# Patient Record
Sex: Male | Born: 1970 | Race: White | Hispanic: No | Marital: Single | Smoking: Current every day smoker
Health system: Southern US, Community
[De-identification: ages and names within clinical notes are randomized; demographics above are authoritative.]

## PROBLEM LIST (undated history)

## (undated) DIAGNOSIS — I2510 Atherosclerotic heart disease of native coronary artery without angina pectoris: Secondary | ICD-10-CM

## (undated) DIAGNOSIS — I4729 Other ventricular tachycardia: Secondary | ICD-10-CM

## (undated) DIAGNOSIS — E7800 Pure hypercholesterolemia, unspecified: Secondary | ICD-10-CM

## (undated) DIAGNOSIS — I255 Ischemic cardiomyopathy: Secondary | ICD-10-CM

## (undated) DIAGNOSIS — I472 Ventricular tachycardia: Secondary | ICD-10-CM

## (undated) DIAGNOSIS — Z720 Tobacco use: Secondary | ICD-10-CM

## (undated) DIAGNOSIS — I10 Essential (primary) hypertension: Secondary | ICD-10-CM

## (undated) DIAGNOSIS — F209 Schizophrenia, unspecified: Secondary | ICD-10-CM

## (undated) HISTORY — DX: Ischemic cardiomyopathy: I25.5

## (undated) HISTORY — DX: Other ventricular tachycardia: I47.29

## (undated) HISTORY — DX: Tobacco use: Z72.0

## (undated) HISTORY — DX: Ventricular tachycardia: I47.2

## (undated) HISTORY — DX: Atherosclerotic heart disease of native coronary artery without angina pectoris: I25.10

---

## 2011-03-04 ENCOUNTER — Emergency Department: Payer: Self-pay | Admitting: *Deleted

## 2011-03-04 LAB — DRUG SCREEN, URINE
Barbiturates, Ur Screen: NEGATIVE (ref ?–200)
Cannabinoid 50 Ng, Ur ~~LOC~~: NEGATIVE (ref ?–50)
Cocaine Metabolite,Ur ~~LOC~~: NEGATIVE (ref ?–300)
MDMA (Ecstasy)Ur Screen: NEGATIVE (ref ?–500)
Phencyclidine (PCP) Ur S: NEGATIVE (ref ?–25)

## 2011-03-04 LAB — URINALYSIS, COMPLETE
Bilirubin,UR: NEGATIVE
Blood: NEGATIVE
Glucose,UR: NEGATIVE mg/dL (ref 0–75)
Ketone: NEGATIVE
Ph: 6 (ref 4.5–8.0)
Protein: NEGATIVE
RBC,UR: NONE SEEN /HPF (ref 0–5)

## 2011-03-05 LAB — ETHANOL
Ethanol %: <0.003 % (ref 0.000–0.080)
Ethanol: <3 mg/dL

## 2011-03-05 LAB — COMPREHENSIVE METABOLIC PANEL
Albumin: 4 g/dL (ref 3.4–5.0)
Albumin: 3.7 g/dL (ref 3.4–5.0)
Alkaline Phosphatase: 80 U/L (ref 50–136)
Alkaline Phosphatase: 86 U/L (ref 50–136)
Anion Gap: 12 (ref 7–16)
Anion Gap: 7 (ref 7–16)
BUN: 6 mg/dL — ABNORMAL LOW (ref 7–18)
BUN: 4 mg/dL — ABNORMAL LOW (ref 7–18)
Bilirubin,Total: 0.2 mg/dL (ref 0.2–1.0)
Bilirubin,Total: 0.4 mg/dL (ref 0.2–1.0)
Calcium, Total: 8.9 mg/dL (ref 8.5–10.1)
Chloride: 101 mmol/L (ref 98–107)
Co2: 28 mmol/L (ref 21–32)
Co2: 27 mmol/L (ref 21–32)
Creatinine: 0.93 mg/dL (ref 0.60–1.30)
EGFR (African American): >60
EGFR (African American): >60
EGFR (Non-African Amer.): >60
EGFR (Non-African Amer.): >60
Glucose: 93 mg/dL (ref 65–99)
Glucose: 93 mg/dL (ref 65–99)
Osmolality: 282 (ref 275–301)
Potassium: 3.7 mmol/L (ref 3.5–5.1)
SGOT(AST): 22 U/L (ref 15–37)
SGPT (ALT): 36 U/L
SGPT (ALT): 30 U/L
Sodium: 143 mmol/L (ref 136–145)
Total Protein: 8.6 g/dL — ABNORMAL HIGH (ref 6.4–8.2)
Total Protein: 7.8 g/dL (ref 6.4–8.2)

## 2011-03-05 LAB — ACETAMINOPHEN LEVEL: Acetaminophen: <2 ug/mL

## 2011-03-05 LAB — CBC
MCH: 32 pg (ref 26.0–34.0)
MCHC: 34.1 g/dL (ref 32.0–36.0)
RDW: 12.6 % (ref 11.5–14.5)
WBC: 14.6 10*3/uL — ABNORMAL HIGH (ref 3.8–10.6)

## 2011-03-28 ENCOUNTER — Emergency Department: Payer: Self-pay | Admitting: Emergency Medicine

## 2011-03-28 LAB — ACETAMINOPHEN LEVEL: Acetaminophen: <2 ug/mL

## 2011-03-28 LAB — CBC
MCH: 31.8 pg (ref 26.0–34.0)
MCHC: 34 g/dL (ref 32.0–36.0)
MCV: 94 fL (ref 80–100)
Platelet: 270 10*3/uL (ref 150–440)
RBC: 4.7 10*6/uL (ref 4.40–5.90)
RDW: 12.7 % (ref 11.5–14.5)
WBC: 10.1 10*3/uL (ref 3.8–10.6)

## 2011-03-28 LAB — COMPREHENSIVE METABOLIC PANEL
Anion Gap: 11 (ref 7–16)
Calcium, Total: 9 mg/dL (ref 8.5–10.1)
Chloride: 105 mmol/L (ref 98–107)
Creatinine: 0.94 mg/dL (ref 0.60–1.30)
EGFR (African American): >60
Potassium: 3.7 mmol/L (ref 3.5–5.1)
SGOT(AST): 25 U/L (ref 15–37)
Sodium: 142 mmol/L (ref 136–145)
Total Protein: 8.3 g/dL — ABNORMAL HIGH (ref 6.4–8.2)

## 2011-03-28 LAB — DRUG SCREEN, URINE
Amphetamines, Ur Screen: NEGATIVE (ref ?–1000)
Barbiturates, Ur Screen: NEGATIVE (ref ?–200)
MDMA (Ecstasy)Ur Screen: NEGATIVE (ref ?–500)
Methadone, Ur Screen: NEGATIVE (ref ?–300)
Opiate, Ur Screen: NEGATIVE (ref ?–300)
Tricyclic, Ur Screen: NEGATIVE (ref ?–1000)

## 2011-03-28 LAB — ETHANOL: Ethanol: <3 mg/dL

## 2011-03-28 LAB — TSH: Thyroid Stimulating Horm: 2 u[IU]/mL

## 2011-12-20 ENCOUNTER — Emergency Department: Payer: Self-pay | Admitting: Emergency Medicine

## 2011-12-20 LAB — SALICYLATE LEVEL: Salicylates, Serum: 3.9 mg/dL — ABNORMAL HIGH

## 2011-12-20 LAB — URINALYSIS, COMPLETE
Bilirubin,UR: NEGATIVE
Blood: NEGATIVE
Ketone: NEGATIVE
Protein: NEGATIVE
RBC,UR: 1 /HPF (ref 0–5)
Specific Gravity: 1.004 (ref 1.003–1.030)
Squamous Epithelial: NONE SEEN
WBC UR: <1 /HPF (ref 0–5)

## 2011-12-20 LAB — COMPREHENSIVE METABOLIC PANEL
Albumin: 3.5 g/dL (ref 3.4–5.0)
Anion Gap: 5 — ABNORMAL LOW (ref 7–16)
BUN: 9 mg/dL (ref 7–18)
Calcium, Total: 9.1 mg/dL (ref 8.5–10.1)
Chloride: 105 mmol/L (ref 98–107)
Co2: 28 mmol/L (ref 21–32)
EGFR (African American): >60
EGFR (Non-African Amer.): >60
Glucose: 89 mg/dL (ref 65–99)
Osmolality: 274 (ref 275–301)
Potassium: 4 mmol/L (ref 3.5–5.1)
SGOT(AST): 21 U/L (ref 15–37)
Sodium: 138 mmol/L (ref 136–145)
Total Protein: 7.7 g/dL (ref 6.4–8.2)

## 2011-12-20 LAB — DRUG SCREEN, URINE
Amphetamines, Ur Screen: NEGATIVE (ref ?–1000)
Benzodiazepine, Ur Scrn: NEGATIVE (ref ?–200)
Cocaine Metabolite,Ur ~~LOC~~: NEGATIVE (ref ?–300)
MDMA (Ecstasy)Ur Screen: NEGATIVE (ref ?–500)
Tricyclic, Ur Screen: NEGATIVE (ref ?–1000)

## 2011-12-20 LAB — TSH: Thyroid Stimulating Horm: 4.08 u[IU]/mL

## 2011-12-20 LAB — CBC
HCT: 44.7 % (ref 40.0–52.0)
HGB: 16 g/dL (ref 13.0–18.0)
MCH: 32.9 pg (ref 26.0–34.0)
WBC: 13.3 10*3/uL — ABNORMAL HIGH (ref 3.8–10.6)

## 2011-12-20 LAB — ETHANOL
Ethanol %: <0.003 % (ref 0.000–0.080)
Ethanol: <3 mg/dL

## 2013-09-10 ENCOUNTER — Emergency Department: Payer: Self-pay | Admitting: Emergency Medicine

## 2013-09-10 LAB — BASIC METABOLIC PANEL
ANION GAP: 5 — AB (ref 7–16)
BUN: 3 mg/dL — AB (ref 7–18)
CHLORIDE: 108 mmol/L — AB (ref 98–107)
Calcium, Total: 8.7 mg/dL (ref 8.5–10.1)
Co2: 26 mmol/L (ref 21–32)
Creatinine: 0.94 mg/dL (ref 0.60–1.30)
EGFR (African American): >60
Glucose: 84 mg/dL (ref 65–99)
Osmolality: 273 (ref 275–301)
Potassium: 3.2 mmol/L — ABNORMAL LOW (ref 3.5–5.1)
Sodium: 139 mmol/L (ref 136–145)

## 2013-09-10 LAB — URINALYSIS, COMPLETE
BACTERIA: NONE SEEN
BILIRUBIN, UR: NEGATIVE
Blood: NEGATIVE
Glucose,UR: NEGATIVE mg/dL (ref 0–75)
KETONE: NEGATIVE
Leukocyte Esterase: NEGATIVE
NITRITE: NEGATIVE
PH: 6 (ref 4.5–8.0)
Protein: NEGATIVE
RBC,UR: <1 /HPF (ref 0–5)
SPECIFIC GRAVITY: 1.004 (ref 1.003–1.030)
SQUAMOUS EPITHELIAL: NONE SEEN

## 2013-09-10 LAB — CBC WITH DIFFERENTIAL/PLATELET
Basophil #: 0.1 10*3/uL (ref 0.0–0.1)
Basophil %: 1 %
EOS ABS: 0.3 10*3/uL (ref 0.0–0.7)
EOS PCT: 2.1 %
HCT: 47.6 % (ref 40.0–52.0)
HGB: 15.9 g/dL (ref 13.0–18.0)
LYMPHS PCT: 30.3 %
Lymphocyte #: 3.9 10*3/uL — ABNORMAL HIGH (ref 1.0–3.6)
MCH: 31.1 pg (ref 26.0–34.0)
MCHC: 33.3 g/dL (ref 32.0–36.0)
MCV: 93 fL (ref 80–100)
Monocyte #: 0.9 x10 3/mm (ref 0.2–1.0)
Monocyte %: 6.8 %
NEUTROS PCT: 59.8 %
Neutrophil #: 7.7 10*3/uL — ABNORMAL HIGH (ref 1.4–6.5)
Platelet: 258 10*3/uL (ref 150–440)
RBC: 5.09 10*6/uL (ref 4.40–5.90)
RDW: 13.9 % (ref 11.5–14.5)
WBC: 12.9 10*3/uL — ABNORMAL HIGH (ref 3.8–10.6)

## 2014-04-29 NOTE — Consult Note (Signed)
[name] [name], BAYLE MR#:  [id number] DATE OF BIRTH:  30-Jan-1970  DATE OF CONSULTATION:  12/21/2011  REFERRING PHYSICIAN:  Dorothea Glassman, MD CONSULTING PHYSICIAN:  Jolanta B. Pucilowska, MD  REASON FOR CONSULTATION: To evaluate a [name] with agitation.   IDENTIFYING DATA: [name] [name] is a 44 year old male with history of schizophrenia.   CHIEF COMPLAINT: " I'm very sorry. "   HISTORY OF PRESENT ILLNESS: [name] [name] has a long history of mental illness. He was brought to [facility] at the beginning of 2013 from [place]. He likes his group home and feels that they take good care of him and that everybody gets along well. For reasons that he cannot understand, on the night of admission, he got mad at the group home, kicked the table and over turned it. He was brought to the Emergency Room. The [name] believes that all medications prescribed are very helpful and that the group home is a good place where he receives good care. He is somewhat clueless about what lead to his misbehavior. He spoke with his father on the phone today. The father felt that the [name] sounded very good and was quite surprised with events leading to admission. The [name] is allowed to return to the group home, but they would like his medications to be adjusted. The [name] denies any symptoms of depression, anxiety, or psychosis. There is no history of alcohol, illicit substance, or prescription pill abuse.   PAST PSYCHIATRIC HISTORY: The [name] had multiple prior hospitalizations including one admission to [facility] that lasted 8 months. He appears to be very stable on the current regimen of medications. He is compliant. There is no history of suicide attempt that we know of.   FAMILY PSYCHIATRIC HISTORY: Unknown.   PAST MEDICAL HISTORY: None.   ALLERGIES: No known drug allergies.   MEDICATIONS ON ADMISSION:  1. Zyprexa 15 mg twice daily.  2. Haldol 5 mg twice daily.  3. Vistaril 50 mg 4 times  daily as needed for anxiety.  4. Neurontin 400 mg 3 times daily.  5. Cogentin 0.5 mg twice daily.   SOCIAL HISTORY: We have limited information. The [name] is originally from [place] and was placed in our area almost a year ago. He has Medicare and Medicaid.   MENTAL STATUS EXAMINATION: The [name] is alert and oriented to person, place, time, and situation. He is very pleasant, polite, and cooperative, rather apologetic about what happened at the group home even though he does not remember or does not want to disclose much details. He does not feel angry at any staff or peer at the home. He is wearing hospital scrubs. He maintains excellent eye contact. His speech is of normal rhythm, rate, and volume. Mood is fine with full affect. Thought processing is logical and goal oriented. Thought content: He denies thoughts of hurting himself or others. There are no delusions or paranoia. There is no evidence of auditory or visual hallucinations. His cognition is grossly intact. He registers three out of three and recalls two out of three objects after five minutes. He can spell cat forward and backward. He knows the current president. His insight and judgment are questionable.   SUICIDE RISK ASSESSMENT: This is a [name] with long history of schizophrenia who was brought to the Emergency Room after he got upset at the group home and overturned a table. There was no aggression towards people in the house. He is apologetic and is thrilled to be able to return to  his group home.   DIAGNOSES:   AXIS I: Undifferentiated schizophrenia.   AXIS II: Deferred.   AXIS III: Deferred.   AXIS IV: Mental illness, conflict at the group home.   AXIS V: GAF 45.   PLAN:  1. The [name] does not meet criteria for involuntary inpatient psychiatric commitment. Please discharge as appropriate.  2. He is to continue all medications as prescribed by his primary psychiatrist. No prescriptions necessary.  3. He will  follow up with Dr. Alver FisherMickiewicz at [facility] on 12/25/2011 at 10 o'clock .  4. His group owner will pick him up. ____________________________ Braulio ConteJolanta B. Jennet MaduroPucilowska, MD jbp:slb D: 12/22/2011 14:17:46 ET T: 12/22/2011 17:48:54 ET JOB#: [id number]  cc: Jolanta B. Jennet MaduroPucilowska, MD, <Dictator> Shari ProwsJOLANTA B PUCILOWSKA MD ELECTRONICALLY SIGNED 12/24/2011 2:18

## 2014-04-29 NOTE — Consult Note (Signed)
Brief Consult Note: Diagnosis: Schizophrenia.   Patient was seen by consultant.   Consult note dictated.   Recommend further assessment or treatment.   Orders entered.   Comments: Mr. Veryl Speaklyler has a h/o schizophrenia. His medications were adjusted recently. Some medications were not picked up from the pharmacy causing the patient to loose his composure. He overturned a table at a group home. Hwe feels guilty about it as they all get along fine there. He is allowed to return to the same group homer.   PLAN: 1. The patient does not meet criteria for IVC. Please discharge as appropriate.   2. He is to continue all medications as prescribed by his primary psychiatrist. No Rx necessary.  3. He will follow up with Dr. Alver FisherMickiewicz on 12/215/2013 at 10:00.   4. Group home owner will pick him up.  Electronic Signatures: Kristine LineaPucilowska, Jolanta (MD)  (Signed 11-Dec-13 16:02)  Authored: Brief Consult Note   Last Updated: 11-Dec-13 16:02 by Kristine LineaPucilowska, Jolanta (MD)

## 2014-04-29 NOTE — Consult Note (Signed)
PATIENT NAMKatheren [name]:  [name] MR#:  [id number] DATE OF BIRTH:  1970-04-27  DATE OF CONSULTATION:  12/21/2011  CONTINUATION:  CONSULTING PHYSICIAN:  Jolanta B. Pucilowska, MD  REVIEW OF SYSTEMS: CONSTITUTIONAL: No fevers or chills. EYES: No double or blurred vision. ENT: No hearing loss. RESPIRATORY: No shortness of breath or cough. CARDIOVASCULAR: No chest pain or orthopnea. GASTROINTESTINAL: No abdominal pain, nausea, vomiting, or diarrhea. GU: No incontinence or frequency. ENDOCRINE: No heat or cold intolerance. LYMPHATIC: No anemia or easy bruising. INTEGUMENTARY: No acne or rash. MUSCULOSKELETAL: No muscle or joint pain. NEUROLOGIC: No tingling or weakness. PSYCHIATRIC: See history of present illness for details.   PHYSICAL EXAMINATION:  VITAL SIGNS: Blood pressure 142/84, pulse 76, respirations 18.   GENERAL: This is a slender middle-aged male wearing a beard, in no acute distress. The rest of the physical examination is deferred to his primary attending.   LABORATORY DATA: Chemistries are within normal limits. Blood alcohol level is zero. LFTs are within normal limits except for alkaline phosphatase of 149, TSH 4.08. Urine tox screen negative for substances. CBC within normal limits except for white blood count of 13.3. Urinalysis is not suggestive of urinary tract infection. Serum acetaminophen less than 2.0. Serum salicylates 3.9.  ____________________________ Braulio ConteJolanta B. Jennet MaduroPucilowska, MD jbp:cbb D: 12/22/2011 14:20:24 ET T: 12/22/2011 18:04:42 ET JOB#: [id number]  cc: Jolanta B. Jennet MaduroPucilowska, MD, <Dictator> Shari ProwsJOLANTA B PUCILOWSKA MD ELECTRONICALLY SIGNED 12/24/2011 2:18

## 2014-09-18 ENCOUNTER — Encounter: Payer: Self-pay | Admitting: Emergency Medicine

## 2014-09-18 DIAGNOSIS — F0390 Unspecified dementia without behavioral disturbance: Secondary | ICD-10-CM | POA: Diagnosis present

## 2014-09-18 DIAGNOSIS — K8064 Calculus of gallbladder and bile duct with chronic cholecystitis without obstruction: Secondary | ICD-10-CM | POA: Diagnosis not present

## 2014-09-18 DIAGNOSIS — F17210 Nicotine dependence, cigarettes, uncomplicated: Secondary | ICD-10-CM | POA: Diagnosis present

## 2014-09-18 DIAGNOSIS — E780 Pure hypercholesterolemia: Secondary | ICD-10-CM | POA: Diagnosis present

## 2014-09-18 DIAGNOSIS — R740 Nonspecific elevation of levels of transaminase and lactic acid dehydrogenase [LDH]: Secondary | ICD-10-CM | POA: Diagnosis not present

## 2014-09-18 DIAGNOSIS — F209 Schizophrenia, unspecified: Secondary | ICD-10-CM | POA: Diagnosis present

## 2014-09-18 LAB — CBC WITH DIFFERENTIAL/PLATELET
BASOS ABS: 0.1 10*3/uL (ref 0–0.1)
Basophils Relative: 1 %
EOS ABS: 0 10*3/uL (ref 0–0.7)
EOS PCT: 0 %
HCT: 48.6 % (ref 40.0–52.0)
Hemoglobin: 16.6 g/dL (ref 13.0–18.0)
LYMPHS PCT: 11 %
Lymphs Abs: 1.5 10*3/uL (ref 1.0–3.6)
MCH: 31 pg (ref 26.0–34.0)
MCHC: 34.2 g/dL (ref 32.0–36.0)
MCV: 90.6 fL (ref 80.0–100.0)
MONO ABS: 0.8 10*3/uL (ref 0.2–1.0)
Monocytes Relative: 6 %
Neutro Abs: 10.9 10*3/uL — ABNORMAL HIGH (ref 1.4–6.5)
Neutrophils Relative %: 82 %
PLATELETS: 265 10*3/uL (ref 150–440)
RBC: 5.37 MIL/uL (ref 4.40–5.90)
RDW: 14.7 % — ABNORMAL HIGH (ref 11.5–14.5)
WBC: 13.4 10*3/uL — ABNORMAL HIGH (ref 3.8–10.6)

## 2014-09-18 LAB — COMPREHENSIVE METABOLIC PANEL
ALBUMIN: 4 g/dL (ref 3.5–5.0)
ALT: 190 U/L — ABNORMAL HIGH (ref 17–63)
AST: 172 U/L — AB (ref 15–41)
Alkaline Phosphatase: 581 U/L — ABNORMAL HIGH (ref 38–126)
Anion gap: 12 (ref 5–15)
CHLORIDE: 102 mmol/L (ref 101–111)
CO2: 26 mmol/L (ref 22–32)
Calcium: 9.2 mg/dL (ref 8.9–10.3)
Creatinine, Ser: 0.94 mg/dL (ref 0.61–1.24)
GFR calc Af Amer: >60 mL/min (ref >60)
GFR calc non Af Amer: >60 mL/min (ref >60)
GLUCOSE: 129 mg/dL — AB (ref 65–99)
POTASSIUM: 3.3 mmol/L — AB (ref 3.5–5.1)
SODIUM: 140 mmol/L (ref 135–145)
Total Bilirubin: 3 mg/dL — ABNORMAL HIGH (ref 0.3–1.2)
Total Protein: 8.7 g/dL — ABNORMAL HIGH (ref 6.5–8.1)

## 2014-09-18 LAB — LIPASE, BLOOD: LIPASE: 23 U/L (ref 22–51)

## 2014-09-18 LAB — URINALYSIS COMPLETE WITH MICROSCOPIC (ARMC ONLY)
BACTERIA UA: NONE SEEN
GLUCOSE, UA: NEGATIVE mg/dL
HGB URINE DIPSTICK: NEGATIVE
Ketones, ur: NEGATIVE mg/dL
Nitrite: NEGATIVE
Protein, ur: 30 mg/dL — AB
SPECIFIC GRAVITY, URINE: 1.014 (ref 1.005–1.030)
pH: 5 (ref 5.0–8.0)

## 2014-09-18 NOTE — ED Notes (Signed)
Pt presents to ED with mid abd pain and vomiting. Pt reports his symptoms have been intermittently reoccurring for the past 3 months and has worsened today. Pt states he was seen by [facility] today and was told he may have ulcers and referred him to a GI specialist. Pt states they didn't do anything to fix his pain so he came here for a second opinion. Pt denies blood in his stool  Or excessive ETOH use.

## 2014-09-19 ENCOUNTER — Emergency Department: Payer: Medicare Other

## 2014-09-19 ENCOUNTER — Inpatient Hospital Stay
Admission: EM | Admit: 2014-09-19 | Discharge: 2014-09-26 | DRG: 419 | Disposition: A | Discharge status: Home / Self Care | Payer: Medicare Other | Attending: Surgery | Admitting: Surgery

## 2014-09-19 ENCOUNTER — Inpatient Hospital Stay: Payer: Medicare Other

## 2014-09-19 DIAGNOSIS — Z4659 Encounter for fitting and adjustment of other gastrointestinal appliance and device: Secondary | ICD-10-CM | POA: Diagnosis not present

## 2014-09-19 DIAGNOSIS — F0390 Unspecified dementia without behavioral disturbance: Secondary | ICD-10-CM | POA: Diagnosis present

## 2014-09-19 DIAGNOSIS — R740 Nonspecific elevation of levels of transaminase and lactic acid dehydrogenase [LDH]: Secondary | ICD-10-CM

## 2014-09-19 DIAGNOSIS — E780 Pure hypercholesterolemia: Secondary | ICD-10-CM | POA: Diagnosis present

## 2014-09-19 DIAGNOSIS — R109 Unspecified abdominal pain: Secondary | ICD-10-CM

## 2014-09-19 DIAGNOSIS — R7401 Elevation of levels of liver transaminase levels: Secondary | ICD-10-CM

## 2014-09-19 DIAGNOSIS — K8064 Calculus of gallbladder and bile duct with chronic cholecystitis without obstruction: Secondary | ICD-10-CM | POA: Diagnosis present

## 2014-09-19 DIAGNOSIS — R100 Acute abdomen: Secondary | ICD-10-CM | POA: Diagnosis not present

## 2014-09-19 DIAGNOSIS — K8050 Calculus of bile duct without cholangitis or cholecystitis without obstruction: Secondary | ICD-10-CM | POA: Diagnosis not present

## 2014-09-19 DIAGNOSIS — F17210 Nicotine dependence, cigarettes, uncomplicated: Secondary | ICD-10-CM | POA: Diagnosis present

## 2014-09-19 DIAGNOSIS — K8020 Calculus of gallbladder without cholecystitis without obstruction: Secondary | ICD-10-CM

## 2014-09-19 DIAGNOSIS — F209 Schizophrenia, unspecified: Secondary | ICD-10-CM | POA: Diagnosis present

## 2014-09-19 HISTORY — DX: Pure hypercholesterolemia, unspecified: E78.00

## 2014-09-19 HISTORY — DX: Schizophrenia, unspecified: F20.9

## 2014-09-19 LAB — CBC
HCT: 45.3 % (ref 40.0–52.0)
HEMOGLOBIN: 15.6 g/dL (ref 13.0–18.0)
MCH: 31.2 pg (ref 26.0–34.0)
MCHC: 34.4 g/dL (ref 32.0–36.0)
MCV: 90.7 fL (ref 80.0–100.0)
PLATELETS: 235 10*3/uL (ref 150–440)
RBC: 4.99 MIL/uL (ref 4.40–5.90)
RDW: 14.4 % (ref 11.5–14.5)
WBC: 10 10*3/uL (ref 3.8–10.6)

## 2014-09-19 LAB — COMPREHENSIVE METABOLIC PANEL
ALBUMIN: 3.4 g/dL — AB (ref 3.5–5.0)
ALK PHOS: 526 U/L — AB (ref 38–126)
ALT: 180 U/L — AB (ref 17–63)
AST: 160 U/L — ABNORMAL HIGH (ref 15–41)
Anion gap: 7 (ref 5–15)
BILIRUBIN TOTAL: 2.4 mg/dL — AB (ref 0.3–1.2)
CALCIUM: 9 mg/dL (ref 8.9–10.3)
CO2: 30 mmol/L (ref 22–32)
CREATININE: 0.83 mg/dL (ref 0.61–1.24)
Chloride: 105 mmol/L (ref 101–111)
GFR calc Af Amer: >60 mL/min (ref >60)
GFR calc non Af Amer: >60 mL/min (ref >60)
GLUCOSE: 94 mg/dL (ref 65–99)
Potassium: 3.4 mmol/L — ABNORMAL LOW (ref 3.5–5.1)
SODIUM: 142 mmol/L (ref 135–145)
TOTAL PROTEIN: 7.4 g/dL (ref 6.5–8.1)

## 2014-09-19 MED ORDER — PANTOPRAZOLE SODIUM 40 MG PO TBEC
40.0000 mg | DELAYED_RELEASE_TABLET | Freq: Every day | ORAL | Status: DC
  Administered 2014-09-19 – 2014-09-25 (×6): 40 mg via ORAL
  Filled 2014-09-19 (×8): qty 1

## 2014-09-19 MED ORDER — GABAPENTIN 400 MG PO CAPS
400.0000 mg | ORAL_CAPSULE | Freq: Three times a day (TID) | ORAL | Status: DC
  Administered 2014-09-19 – 2014-09-26 (×19): 400 mg via ORAL
  Filled 2014-09-19 (×20): qty 1

## 2014-09-19 MED ORDER — OLANZAPINE 7.5 MG PO TABS
15.0000 mg | ORAL_TABLET | Freq: Two times a day (BID) | ORAL | Status: DC
  Administered 2014-09-19 – 2014-09-26 (×13): 15 mg via ORAL
  Filled 2014-09-19 (×13): qty 2

## 2014-09-19 MED ORDER — HALOPERIDOL 5 MG PO TABS
5.0000 mg | ORAL_TABLET | Freq: Two times a day (BID) | ORAL | Status: DC
  Administered 2014-09-19 – 2014-09-26 (×13): 5 mg via ORAL
  Filled 2014-09-19 (×13): qty 1

## 2014-09-19 MED ORDER — FAMOTIDINE 20 MG PO TABS
20.0000 mg | ORAL_TABLET | Freq: Every day | ORAL | Status: DC
  Administered 2014-09-19 – 2014-09-26 (×7): 20 mg via ORAL
  Filled 2014-09-19 (×8): qty 1

## 2014-09-19 MED ORDER — HYDROXYZINE HCL 50 MG PO TABS
50.0000 mg | ORAL_TABLET | Freq: Four times a day (QID) | ORAL | Status: DC | PRN
  Administered 2014-09-19: 50 mg via ORAL
  Filled 2014-09-19 (×2): qty 1

## 2014-09-19 MED ORDER — SODIUM CHLORIDE 0.9 % IV SOLN
INTRAVENOUS | Status: AC
  Filled 2014-09-19: qty 3

## 2014-09-19 MED ORDER — ATORVASTATIN CALCIUM 20 MG PO TABS
40.0000 mg | ORAL_TABLET | Freq: Every day | ORAL | Status: DC
  Administered 2014-09-21 – 2014-09-26 (×6): 40 mg via ORAL
  Filled 2014-09-19 (×7): qty 2

## 2014-09-19 MED ORDER — SODIUM CHLORIDE 0.9 % IV SOLN
3.0000 g | Freq: Four times a day (QID) | INTRAVENOUS | Status: DC
  Administered 2014-09-19 – 2014-09-20 (×6): 3 g via INTRAVENOUS
  Filled 2014-09-19 (×9): qty 3

## 2014-09-19 MED ORDER — NICOTINE 10 MG IN INHA
1.0000 | RESPIRATORY_TRACT | Status: DC | PRN
  Administered 2014-09-19 – 2014-09-25 (×10): 1 via RESPIRATORY_TRACT
  Filled 2014-09-19 (×2): qty 36

## 2014-09-19 MED ORDER — BUDESONIDE 0.25 MG/2ML IN SUSP
0.2500 mg | Freq: Two times a day (BID) | RESPIRATORY_TRACT | Status: DC
  Administered 2014-09-19 – 2014-09-22 (×7): 0.25 mg via RESPIRATORY_TRACT
  Filled 2014-09-19 (×12): qty 2

## 2014-09-19 MED ORDER — HYDROCODONE-ACETAMINOPHEN 5-325 MG PO TABS
1.0000 | ORAL_TABLET | ORAL | Status: DC | PRN
  Administered 2014-09-20 – 2014-09-21 (×2): 1 via ORAL
  Administered 2014-09-22 – 2014-09-26 (×2): 2 via ORAL
  Filled 2014-09-19: qty 2
  Filled 2014-09-19: qty 1
  Filled 2014-09-19: qty 2
  Filled 2014-09-19: qty 1

## 2014-09-19 MED ORDER — ALBUTEROL SULFATE HFA 108 (90 BASE) MCG/ACT IN AERS: 2.0000 | INHALATION_SPRAY | RESPIRATORY_TRACT | Status: DC | PRN

## 2014-09-19 MED ORDER — ONDANSETRON HCL 4 MG/2ML IJ SOLN
4.0000 mg | Freq: Four times a day (QID) | INTRAMUSCULAR | Status: DC | PRN
  Administered 2014-09-22: 4 mg via INTRAVENOUS
  Filled 2014-09-19 (×2): qty 2

## 2014-09-19 MED ORDER — MORPHINE SULFATE (PF) 2 MG/ML IV SOLN
2.0000 mg | INTRAVENOUS | Status: DC | PRN
  Administered 2014-09-20 – 2014-09-23 (×9): 2 mg via INTRAVENOUS
  Filled 2014-09-19 (×9): qty 1

## 2014-09-19 MED ORDER — LORAZEPAM 1 MG PO TABS
1.0000 mg | ORAL_TABLET | Freq: Two times a day (BID) | ORAL | Status: DC
  Administered 2014-09-19 – 2014-09-26 (×13): 1 mg via ORAL
  Filled 2014-09-19 (×13): qty 1

## 2014-09-19 MED ORDER — HEPARIN SODIUM (PORCINE) 5000 UNIT/ML IJ SOLN
5000.0000 [IU] | Freq: Three times a day (TID) | INTRAMUSCULAR | Status: DC
Start: 2014-09-19 — End: 2014-09-22
  Administered 2014-09-19 – 2014-09-21 (×8): 5000 [IU] via SUBCUTANEOUS
  Filled 2014-09-19 (×8): qty 1

## 2014-09-19 MED ORDER — HYDROXYZINE HCL 50 MG PO TABS: 50.0000 mg | ORAL_TABLET | Freq: Four times a day (QID) | ORAL | Status: DC | PRN

## 2014-09-19 MED ORDER — ONDANSETRON HCL 4 MG PO TABS: 4.0000 mg | ORAL_TABLET | Freq: Four times a day (QID) | ORAL | Status: DC | PRN

## 2014-09-19 MED ORDER — GADOBENATE DIMEGLUMINE 529 MG/ML IV SOLN
15.0000 mL | Freq: Once | INTRAVENOUS | Status: AC | PRN
  Administered 2014-09-19: 13 mL via INTRAVENOUS

## 2014-09-19 MED ORDER — LACTATED RINGERS IV SOLN
INTRAVENOUS | Status: DC
  Administered 2014-09-19: 21:00:00 via INTRAVENOUS
  Administered 2014-09-19: 1000 mL via INTRAVENOUS
  Administered 2014-09-19 – 2014-09-20 (×2): via INTRAVENOUS

## 2014-09-19 MED ORDER — BENZTROPINE MESYLATE 1 MG PO TABS
0.5000 mg | ORAL_TABLET | Freq: Two times a day (BID) | ORAL | Status: DC
  Administered 2014-09-19 – 2014-09-26 (×13): 0.5 mg via ORAL
  Filled 2014-09-19 (×14): qty 1

## 2014-09-19 MED ORDER — ALBUTEROL SULFATE (2.5 MG/3ML) 0.083% IN NEBU
3.0000 mL | INHALATION_SOLUTION | RESPIRATORY_TRACT | Status: DC | PRN
  Filled 2014-09-19 (×2): qty 3

## 2014-09-19 NOTE — Progress Notes (Signed)
Patient given PO morning medication per patient request and due to pending MRCP, patient will remain NPO 4 hours prior to procedure

## 2014-09-19 NOTE — Progress Notes (Signed)
Patient ID: [name], male   DOB: [birth year], 44 y.o.   MRN: [id number]   Assuming care from Dr. Excell Seltzer. I have personally reviewed the images of the ultrasound and laboratory values. Total bilirubin is now 3 from 4 last night. The patient is without pain. There is dilatation on the ultrasound of the common bile duct and the such an MRCP will be obtained. Further disposition based on findings.

## 2014-09-19 NOTE — ED Notes (Signed)
Please notify Viviann Spare (caregiver at group home where the pt resides) when the pt is ready for admission or discharge [phone].

## 2014-09-19 NOTE — ED Notes (Signed)
Spoke with Matt from pharmacy who verbally verified the compatibility of infusing Unasyn concurrently with LR.

## 2014-09-19 NOTE — ED Provider Notes (Signed)
[facility] Emergency Department Provider Note  ____________________________________________  Time seen: 2:00 AM  I have reviewed the triage vital signs and the nursing notes.   HISTORY  Chief Complaint Abdominal Pain and Emesis      HPI [name] is a 44 y.o. male presents with mid epigastric/right upper quadrant pain and vomiting times proximally 3 months with acute worsening today. Patient states that the pain is aggravated after eating. Patient denies any diarrhea no fever. Patient states that he was told that he may have ulcers by GI specialist. Current pain score 8 out of 10 right upper quadrant/epigastric and nonradiating. Patient denies any alleviating factors   Past Medical History  Diagnosis Date  . Schizophrenia   . Hypercholesteremia     There are no active problems to display for this patient.   Past surgical history  the No current outpatient prescriptions on file.  Allergies None No family history on file.  Social History Social History  Substance Use Topics  . Smoking status: Current Every Day Smoker -- 1.00 packs/day    Types: Cigarettes  . Smokeless tobacco: None  . Alcohol Use: No    Review of Systems  Constitutional: Negative for fever. Eyes: Negative for visual changes. ENT: Negative for sore throat. Cardiovascular: Negative for chest pain. Respiratory: Negative for shortness of breath. Gastrointestinal: Positive for abdominal pain, positive for vomiting. Genitourinary: Negative for dysuria. Musculoskeletal: Negative for back pain. Skin: Negative for rash. Neurological: Negative for headaches, focal weakness or numbness.   10-point ROS otherwise negative.  ____________________________________________   PHYSICAL EXAM:  VITAL SIGNS: ED Triage Vitals  Enc Vitals Group     BP 09/18/14 2004 134/84 mmHg     Pulse Rate 09/18/14 2004 95     Resp 09/18/14 2004 18     Temp 09/18/14 2004 98.3 F (36.8 C)      Temp Source 09/18/14 2004 Oral     SpO2 09/18/14 2004 97 %     Weight 09/18/14 2004 150 lb (68.04 kg)     Height 09/18/14 2004  (1.753 m)     Head Cir --      Peak Flow --      Pain Score 09/18/14 2005 10     Pain Loc --      Pain Edu? --      Excl. in GC? --      Constitutional: Alert and oriented. Well appearing and in no distress. Eyes: Conjunctivae are normal. PERRL. Normal extraocular movements. ENT   Head: Normocephalic and atraumatic.   Nose: No congestion/rhinnorhea.   Mouth/Throat: Mucous membranes are moist.   Neck: No stridor. Hematological/Lymphatic/Immunilogical: No cervical lymphadenopathy. Cardiovascular: Normal rate, regular rhythm. Normal and symmetric distal pulses are present in all extremities. No murmurs, rubs, or gallops. Respiratory: Normal respiratory effort without tachypnea nor retractions. Breath sounds are clear and equal bilaterally. No wheezes/rales/rhonchi. Gastrointestinal: Right upper quadrant/epigastric pain with palpation. No distention. There is no CVA tenderness. Genitourinary: deferred Musculoskeletal: Nontender with normal range of motion in all extremities. No joint effusions.  No lower extremity tenderness nor edema. Neurologic:  Normal speech and language. No gross focal neurologic deficits are appreciated. Speech is normal.  Skin:  Skin is warm, dry and intact. No rash noted. Psychiatric: Mood and affect are normal. Speech and behavior are normal. Patient exhibits appropriate insight and judgment.  ____________________________________________    LABS (pertinent positives/negatives)  Labs Reviewed  CBC WITH DIFFERENTIAL/PLATELET - Abnormal; Notable for the following:  WBC 13.4 (*)    RDW 14.7 (*)    Neutro Abs 10.9 (*)    All other components within normal limits  COMPREHENSIVE METABOLIC PANEL - Abnormal; Notable for the following:    Potassium 3.3 (*)    Glucose, Bld 129 (*)    BUN <5 (*)    Total Protein 8.7  (*)    AST 172 (*)    ALT 190 (*)    Alkaline Phosphatase 581 (*)    Total Bilirubin 3.0 (*)    All other components within normal limits  URINALYSIS COMPLETEWITH MICROSCOPIC ([facility] ONLY) - Abnormal; Notable for the following:    Color, Urine AMBER (*)    APPearance CLEAR (*)    Bilirubin Urine 2+ (*)    Protein, ur 30 (*)    Leukocytes, UA TRACE (*)    Squamous Epithelial / LPF 0-5 (*)    All other components within normal limits  LIPASE, BLOOD      RADIOLOGY  US Abdomen Limited RUQ (Final result) Result time: 09/19/14 02:25:03   Final result by Rad Results In Interface (09/19/14 02:25:03)   Narrative:   CLINICAL DATA: Abdominal pain off and on for 3-4 months. Transaminitis.  EXAM: US ABDOMEN LIMITED - RIGHT UPPER QUADRANT  COMPARISON: None.  FINDINGS: Gallbladder:  Cholelithiasis with multiple stones in the gallbladder. Additional stone in the gallbladder neck was nonmobile and measures 1 cm diameter. No gallbladder wall thickening or edema. Murphy's sign is negative.  Common bile duct:  Diameter: 4.9 mm, normal  Liver:  No focal lesion identified. Within normal limits in parenchymal echogenicity.  IMPRESSION: Cholelithiasis without additional findings of cholecystitis.   Electronically Signed By: Burman Nieves M.D. On: 09/19/2014 02:25      INITIAL IMPRESSION / ASSESSMENT AND PLAN / ED COURSE  Pertinent labs & imaging results that were available during my care of the patient were reviewed by me and considered in my medical decision making (see chart for details).  History of physical exam concerning for possible choledocholithiasis given elevated liver enzymes and bilirubin plus leukocytosis. As such patient was discussed with Dr. Excell Seltzer for evaluation.  ____________________________________________   FINAL CLINICAL IMPRESSION(S) / ED DIAGNOSES  Final diagnoses:  Calculus of gallbladder without cholecystitis without obstruction       Darci Current, MD 09/19/14 [phone]

## 2014-09-19 NOTE — H&P (Signed)
[name] is an 44 y.o. male.    Chief Complaint: Right upper quadrant pain, resolved  HPI: This a patient with 4 months of abdominal pain that he could not take it anymore so he came to the emergency room tonight however at the time of my visit his pain is completely resolved. He points the right upper quadrant as a source of his pain. He's had nausea but no emesis no fevers or chills he's not sure if he's had a dark urine and has not noticed any acholic stools no melena or hematochezia he denies significant back pain. He is treated for significant schizophrenia and is disabled for that but has not had any abdominal surgery.  Past Medical History  Diagnosis Date  . Schizophrenia   . Hypercholesteremia     No past surgical history on file.  No family history on file. Social History:  reports that he has been smoking Cigarettes.  He has been smoking about 1.00 pack per day. He does not have any smokeless tobacco history on file. He reports that he does not drink alcohol. His drug history is not on file.  Allergies: No Known Allergies   (Not in a hospital admission)   Review of Systems  Constitutional: Negative for fever and chills.  HENT: Negative.   Eyes: Negative.   Respiratory: Negative.   Cardiovascular: Negative.   Gastrointestinal: Positive for nausea and abdominal pain. Negative for heartburn, vomiting, diarrhea, constipation, blood in stool and melena.  Genitourinary: Negative.   Musculoskeletal: Negative.   Skin: Negative.   Neurological: Negative.  Negative for weakness.  Endo/Heme/Allergies: Negative.   Psychiatric/Behavioral:       Patient is schizophrenic     Physical Exam:  BP 116/84 mmHg  Pulse 90  Temp(Src) 97.7 F (36.5 C) (Oral)  Resp 16  Ht 5\' 9"  (1.753 m)  Wt 150 lb (68.04 kg)  BMI 22.14 kg/m2  SpO2 98%  Physical Exam  Constitutional: He is oriented to person, place, and time and well-developed, well-nourished, and in no distress. No  distress.  HENT:  Head: Normocephalic and atraumatic.  Eyes: No scleral icterus.  Neck: Normal range of motion.  Cardiovascular: Normal rate and regular rhythm.   Pulmonary/Chest: Effort normal and breath sounds normal. No respiratory distress. He has no wheezes. He has no rales.  Abdominal: Soft. Bowel sounds are normal. He exhibits no distension. There is no tenderness. There is no rebound and no guarding.  No right upper quadrant tenderness no Murphy sign.  Musculoskeletal: Normal range of motion.  Lymphadenopathy:    He has no cervical adenopathy.  Neurological: He is alert and oriented to person, place, and time.  Skin: Skin is warm and dry.  Psychiatric: Mood and affect normal.        Results for orders placed or performed during the hospital encounter of 09/19/14 (from the past 48 hour(s))  CBC WITH DIFFERENTIAL     Status: Abnormal   Collection Time: 09/18/14  8:39 PM  Result Value Ref Range   WBC 13.4 (H) 3.8 - 10.6 K/uL   RBC 5.37 4.40 - 5.90 MIL/uL   Hemoglobin 16.6 13.0 - 18.0 g/dL   HCT 11/18/14 40.6 - 84.0 %   MCV 90.6 80.0 - 100.0 fL   MCH 31.0 26.0 - 34.0 pg   MCHC 34.2 32.0 - 36.0 g/dL   RDW 33.5 (H) 33.1 - 74.0 %   Platelets 265 150 - 440 K/uL   Neutrophils Relative % 82 %  Neutro Abs 10.9 (H) 1.4 - 6.5 K/uL   Lymphocytes Relative 11 %   Lymphs Abs 1.5 1.0 - 3.6 K/uL   Monocytes Relative 6 %   Monocytes Absolute 0.8 0.2 - 1.0 K/uL   Eosinophils Relative 0 %   Eosinophils Absolute 0.0 0 - 0.7 K/uL   Basophils Relative 1 %   Basophils Absolute 0.1 0 - 0.1 K/uL  Comprehensive metabolic panel     Status: Abnormal   Collection Time: 09/18/14  8:39 PM  Result Value Ref Range   Sodium 140 135 - 145 mmol/L   Potassium 3.3 (L) 3.5 - 5.1 mmol/L   Chloride 102 101 - 111 mmol/L   CO2 26 22 - 32 mmol/L   Glucose, Bld 129 (H) 65 - 99 mg/dL   BUN <5 (L) 6 - 20 mg/dL   Creatinine, Ser 0.94 0.61 - 1.24 mg/dL   Calcium 9.2 8.9 - 10.3 mg/dL   Total Protein 8.7 (H)  6.5 - 8.1 g/dL   Albumin 4.0 3.5 - 5.0 g/dL   AST 172 (H) 15 - 41 U/L   ALT 190 (H) 17 - 63 U/L   Alkaline Phosphatase 581 (H) 38 - 126 U/L   Total Bilirubin 3.0 (H) 0.3 - 1.2 mg/dL   GFR calc non Af Amer >60 >60 mL/min   GFR calc Af Amer >60 >60 mL/min    Comment: (NOTE) The eGFR has been calculated using the CKD EPI equation. This calculation has not been validated in all clinical situations. eGFR's persistently <60 mL/min signify possible Chronic Kidney Disease.    Anion gap 12 5 - 15  Lipase, blood     Status: None   Collection Time: 09/18/14  8:39 PM  Result Value Ref Range   Lipase 23 22 - 51 U/L  Urinalysis complete, with microscopic ([facility] only)     Status: Abnormal   Collection Time: 09/18/14  8:39 PM  Result Value Ref Range   Color, Urine AMBER (A) YELLOW   APPearance CLEAR (A) CLEAR   Glucose, UA NEGATIVE NEGATIVE mg/dL   Bilirubin Urine 2+ (A) NEGATIVE   Ketones, ur NEGATIVE NEGATIVE mg/dL   Specific Gravity, Urine 1.014 1.005 - 1.030   Hgb urine dipstick NEGATIVE NEGATIVE   pH 5.0 5.0 - 8.0   Protein, ur 30 (A) NEGATIVE mg/dL   Nitrite NEGATIVE NEGATIVE   Leukocytes, UA TRACE (A) NEGATIVE   RBC / HPF 6-30 0 - 5 RBC/hpf   WBC, UA 6-30 0 - 5 WBC/hpf   Bacteria, UA NONE SEEN NONE SEEN   Squamous Epithelial / LPF 0-5 (A) NONE SEEN   Mucous PRESENT    Ca Oxalate Crys, UA PRESENT    US Abdomen Limited Ruq  09/19/2014   CLINICAL DATA:  Abdominal pain off and on for 3-4 months. Transaminitis.  EXAM: US ABDOMEN LIMITED - RIGHT UPPER QUADRANT  COMPARISON:  None.  FINDINGS: Gallbladder:  Cholelithiasis with multiple stones in the gallbladder. Additional stone in the gallbladder neck was nonmobile and measures 1 cm diameter. No gallbladder wall thickening or edema. Murphy's sign is negative.  Common bile duct:  Diameter: 4.9 mm, normal  Liver:  No focal lesion identified. Within normal limits in parenchymal echogenicity.  IMPRESSION: Cholelithiasis without additional findings  of cholecystitis.   Electronically Signed   By: Lucienne Capers M.D.   On: 09/19/2014 02:25     Assessment/Plan  Ultrasound shows stones with no gallbladder wall thickening. Liver function tests are significantly elevated.  Patient  with a history of 4 months of abdominal pain in the right upper quadrant and today it was unrelenting but as currently resolved completely. He likely has biliary colic however he does have elevated liver function tests with some mild jaundice. Demented admission to the hospital checking liver function tests starting IV anabiotic's is and fluids and consideration for laparoscopic cholecystectomy in the next 24-48 hours depending on LFT changes and possible ERCP. This was all discussed with the patient and understood and agreed to proceed  Florene Glen, MD, FACS

## 2014-09-19 NOTE — Clinical Social Work Note (Signed)
Clinical Social Work Assessment  Patient Details  Name: [name] MRN: [id number] Date of Birth: 1970/12/05  Date of referral:  09/19/14               Reason for consult:  Facility Placement (pt is from Group Home)                Permission sought to share information with:  Facility Industrial/product designer granted to share information::  Yes, Verbal Permission Granted  Name::        Agency::  Group Home   Relationship::     Contact Information:     Housing/Transportation Living arrangements for the past 2 months:  Group Home Source of Information:  Patient, Medical Team Patient Interpreter Needed:  None Criminal Activity/Legal Involvement Pertinent to Current Situation/Hospitalization:  No - Comment as needed Significant Relationships:  None Lives with:  Facility Resident Do you feel safe going back to the place where you live?  Yes Need for family participation in patient care:  No (Coment)  Care giving concerns:  None expressed at this time   Office manager / plan:  CSW spoke to pt.  He verified that he is from a group home.  [name] is the owner of the group home.  CSW verified that pt is in agreement with return to group home at DC.  He denies having any family and stated he has been on disability since 1993 or 1994.  CSW will continue to follow   Employment status:  Disabled (Comment on whether or not currently receiving Disability) Insurance information:  Medicare, Medicaid In [place] PT Recommendations:    Information / Referral to community resources:     Patient/Family's Response to care:  Pt as in agreement with return to group home  Patient/Family's Understanding of and Emotional Response to Diagnosis, Current Treatment, and Prognosis:  Pt thanked CSW for speaking to him and verbalized his understanding of DC back to group home.  Emotional Assessment Appearance:  Appears stated age Attitude/Demeanor/Rapport:   (appropriate) Affect  (typically observed):  Appropriate Orientation:  Oriented to Self, Oriented to Place, Oriented to  Time, Oriented to Situation Alcohol / Substance use:  Never Used Psych involvement (Current and /or in the community):  No (Comment)  Discharge Needs  Concerns to be addressed:  No discharge needs identified Readmission within the last 30 days:  No Current discharge risk:    Barriers to Discharge:  No Barriers Identified   Chauncy Passy, LCSW 09/19/2014, 3:08 PM

## 2014-09-19 NOTE — ED Notes (Signed)
MD at bedside. 

## 2014-09-20 ENCOUNTER — Encounter: Payer: Self-pay | Admitting: Anesthesiology

## 2014-09-20 ENCOUNTER — Encounter
Admission: EM | Disposition: A | Discharge status: Home / Self Care | Payer: Self-pay | Source: Home / Self Care | Attending: Surgery

## 2014-09-20 ENCOUNTER — Inpatient Hospital Stay: Payer: Medicare Other

## 2014-09-20 ENCOUNTER — Inpatient Hospital Stay: Payer: Medicare Other | Admitting: Anesthesiology

## 2014-09-20 DIAGNOSIS — R100 Acute abdomen: Secondary | ICD-10-CM

## 2014-09-20 DIAGNOSIS — R109 Unspecified abdominal pain: Secondary | ICD-10-CM | POA: Insufficient documentation

## 2014-09-20 HISTORY — PX: CHOLECYSTECTOMY: SHX55

## 2014-09-20 LAB — COMPREHENSIVE METABOLIC PANEL
ALT: 108 U/L — AB (ref 17–63)
AST: 67 U/L — AB (ref 15–41)
Albumin: 2.9 g/dL — ABNORMAL LOW (ref 3.5–5.0)
Alkaline Phosphatase: 373 U/L — ABNORMAL HIGH (ref 38–126)
Anion gap: 5 (ref 5–15)
BUN: 5 mg/dL — AB (ref 6–20)
CHLORIDE: 110 mmol/L (ref 101–111)
CO2: 28 mmol/L (ref 22–32)
CREATININE: 0.84 mg/dL (ref 0.61–1.24)
Calcium: 8.5 mg/dL — ABNORMAL LOW (ref 8.9–10.3)
GFR calc Af Amer: >60 mL/min (ref >60)
GFR calc non Af Amer: >60 mL/min (ref >60)
Glucose, Bld: 85 mg/dL (ref 65–99)
Potassium: 3.7 mmol/L (ref 3.5–5.1)
SODIUM: 143 mmol/L (ref 135–145)
Total Bilirubin: 0.8 mg/dL (ref 0.3–1.2)
Total Protein: 6.2 g/dL — ABNORMAL LOW (ref 6.5–8.1)

## 2014-09-20 LAB — MRSA PCR SCREENING: MRSA by PCR: NEGATIVE

## 2014-09-20 SURGERY — LAPAROSCOPIC CHOLECYSTECTOMY
Anesthesia: General

## 2014-09-20 MED ORDER — PROPOFOL 10 MG/ML IV BOLUS
INTRAVENOUS | Status: DC | PRN
  Administered 2014-09-20: 140 mg via INTRAVENOUS

## 2014-09-20 MED ORDER — ONDANSETRON HCL 4 MG/2ML IJ SOLN: 4.0000 mg | Freq: Once | INTRAMUSCULAR | Status: DC | PRN

## 2014-09-20 MED ORDER — ROCURONIUM BROMIDE 100 MG/10ML IV SOLN
INTRAVENOUS | Status: DC | PRN
  Administered 2014-09-20: 40 mg via INTRAVENOUS

## 2014-09-20 MED ORDER — LACTATED RINGERS IV SOLN
INTRAVENOUS | Status: DC
  Administered 2014-09-20 – 2014-09-26 (×14): via INTRAVENOUS

## 2014-09-20 MED ORDER — BUPIVACAINE HCL (PF) 0.25 % IJ SOLN
INTRAMUSCULAR | Status: AC
  Filled 2014-09-20: qty 30

## 2014-09-20 MED ORDER — FENTANYL CITRATE (PF) 100 MCG/2ML IJ SOLN
25.0000 ug | INTRAMUSCULAR | Status: DC | PRN
  Administered 2014-09-20 (×4): 25 ug via INTRAVENOUS

## 2014-09-20 MED ORDER — FENTANYL CITRATE (PF) 100 MCG/2ML IJ SOLN
INTRAMUSCULAR | Status: AC
  Filled 2014-09-20: qty 2

## 2014-09-20 MED ORDER — MIDAZOLAM HCL 2 MG/2ML IJ SOLN
INTRAMUSCULAR | Status: DC | PRN
  Administered 2014-09-20: 2 mg via INTRAVENOUS

## 2014-09-20 MED ORDER — GLYCOPYRROLATE 0.2 MG/ML IJ SOLN
INTRAMUSCULAR | Status: DC | PRN
  Administered 2014-09-20: 0.6 mg via INTRAVENOUS

## 2014-09-20 MED ORDER — ONDANSETRON HCL 4 MG/2ML IJ SOLN
INTRAMUSCULAR | Status: DC | PRN
  Administered 2014-09-20: 4 mg via INTRAVENOUS

## 2014-09-20 MED ORDER — LIDOCAINE HCL (CARDIAC) 20 MG/ML IV SOLN
INTRAVENOUS | Status: DC | PRN
  Administered 2014-09-20: 60 mg via INTRAVENOUS

## 2014-09-20 MED ORDER — KETOROLAC TROMETHAMINE 30 MG/ML IJ SOLN
INTRAMUSCULAR | Status: DC | PRN
  Administered 2014-09-20: 30 mg via INTRAVENOUS

## 2014-09-20 MED ORDER — BUPIVACAINE HCL 0.25 % IJ SOLN
INTRAMUSCULAR | Status: DC | PRN
  Administered 2014-09-20: 20 mL

## 2014-09-20 MED ORDER — LACTATED RINGERS IV SOLN
INTRAVENOUS | Status: DC | PRN
  Administered 2014-09-20: 12:00:00 via INTRAVENOUS

## 2014-09-20 MED ORDER — FENTANYL CITRATE (PF) 100 MCG/2ML IJ SOLN
INTRAMUSCULAR | Status: DC | PRN
  Administered 2014-09-20: 100 ug via INTRAVENOUS

## 2014-09-20 MED ORDER — IOTHALAMATE MEGLUMINE 60 % INJ SOLN
INTRAMUSCULAR | Status: DC | PRN
  Administered 2014-09-20: 10 mL

## 2014-09-20 MED ORDER — NEOSTIGMINE METHYLSULFATE 10 MG/10ML IV SOLN
INTRAVENOUS | Status: DC | PRN
  Administered 2014-09-20: 3 mg via INTRAVENOUS

## 2014-09-20 SURGICAL SUPPLY — 53 items
APPLIER CLIP 5 13 M/L LIGAMAX5 (MISCELLANEOUS) ×6
BAG COUNTER SPONGE EZ (MISCELLANEOUS) IMPLANT
BENZOIN TINCTURE PRP APPL 2/3 (GAUZE/BANDAGES/DRESSINGS) ×3 IMPLANT
BULB RESERV EVAC DRAIN JP 100C (MISCELLANEOUS) ×3 IMPLANT
CANISTER SUCT 1200ML W/VALVE (MISCELLANEOUS) ×3 IMPLANT
CANISTER SUCT 3000ML PPV (MISCELLANEOUS) ×3 IMPLANT
CATH CHOLANG 76X19 KUMAR (CATHETERS) ×3 IMPLANT
CHLORAPREP W/TINT 26ML (MISCELLANEOUS) ×3 IMPLANT
CLIP APPLIE 5 13 M/L LIGAMAX5 (MISCELLANEOUS) ×2 IMPLANT
CLOSURE WOUND 1/2 X4 (GAUZE/BANDAGES/DRESSINGS) ×1
COUNTER SPONGE BAG EZ (MISCELLANEOUS)
DECANTER SPIKE VIAL GLASS SM (MISCELLANEOUS) ×6 IMPLANT
DEFOGGER SCOPE WARMER CLEARIFY (MISCELLANEOUS) ×3 IMPLANT
DISSECTOR KITTNER STICK (MISCELLANEOUS) ×1 IMPLANT
DISSECTORS/KITTNER STICK (MISCELLANEOUS) ×3
DRAIN CHANNEL JP 19F (MISCELLANEOUS) ×3 IMPLANT
DRAPE C-ARM XRAY 36X54 (DRAPES) ×3 IMPLANT
DRAPE SHEET LG 3/4 BI-LAMINATE (DRAPES) ×3 IMPLANT
DRAPE UTILITY 15X26 TOWEL STRL (DRAPES) ×6 IMPLANT
DRESSING TELFA 4X3 1S ST N-ADH (GAUZE/BANDAGES/DRESSINGS) ×3 IMPLANT
DRSG TEGADERM 2-3/8X2-3/4 SM (GAUZE/BANDAGES/DRESSINGS) ×3 IMPLANT
DRSG TELFA 3X8 NADH (GAUZE/BANDAGES/DRESSINGS) IMPLANT
ENDOPOUCH RETRIEVER 10 (MISCELLANEOUS) ×3 IMPLANT
GLOVE BIO SURGEON STRL SZ7.5 (GLOVE) ×9 IMPLANT
GOWN STRL REUS W/ TWL LRG LVL3 (GOWN DISPOSABLE) ×1 IMPLANT
GOWN STRL REUS W/ TWL XL LVL3 (GOWN DISPOSABLE) ×1 IMPLANT
GOWN STRL REUS W/TWL LRG LVL3 (GOWN DISPOSABLE) ×2
GOWN STRL REUS W/TWL XL LVL3 (GOWN DISPOSABLE) ×2
IRRIGATION STRYKERFLOW (MISCELLANEOUS) ×1 IMPLANT
IRRIGATOR STRYKERFLOW (MISCELLANEOUS) ×3
IV NS 1000ML (IV SOLUTION) ×4
IV NS 1000ML BAXH (IV SOLUTION) ×2 IMPLANT
LABEL OR SOLS (LABEL) ×3 IMPLANT
NEEDLE HYPO 25X1 1.5 SAFETY (NEEDLE) ×3 IMPLANT
NS IRRIG 500ML POUR BTL (IV SOLUTION) ×3 IMPLANT
PACK LAP CHOLECYSTECTOMY (MISCELLANEOUS) ×3 IMPLANT
PAD GROUND ADULT SPLIT (MISCELLANEOUS) ×3 IMPLANT
SCISSORS METZENBAUM CVD 33 (INSTRUMENTS) ×3 IMPLANT
SLEEVE ADV FIXATION 5X100MM (TROCAR) ×6 IMPLANT
SPONGE DRAIN TRACH 4X4 STRL 2S (GAUZE/BANDAGES/DRESSINGS) ×3 IMPLANT
STRAP SAFETY BODY (MISCELLANEOUS) ×3 IMPLANT
STRIP CLOSURE SKIN 1/2X4 (GAUZE/BANDAGES/DRESSINGS) ×2 IMPLANT
SUT ETHILON 3-0 FS-10 30 BLK (SUTURE)
SUT ETHILON 4-0 (SUTURE) ×2
SUT ETHILON 4-0 FS2 18XMFL BLK (SUTURE) ×1
SUT VIC AB 0 UR5 27 (SUTURE) ×9 IMPLANT
SUT VIC AB 4-0 FS2 27 (SUTURE) ×3 IMPLANT
SUTURE EHLN 3-0 FS-10 30 BLK (SUTURE) IMPLANT
SUTURE ETHLN 4-0 FS2 18XMF BLK (SUTURE) ×1 IMPLANT
SWABSTK COMLB BENZOIN TINCTURE (MISCELLANEOUS) ×3 IMPLANT
TROCAR XCEL BLUNT TIP 100MML (ENDOMECHANICALS) ×3 IMPLANT
TROCAR Z-THREAD OPTICAL 5X100M (TROCAR) ×3 IMPLANT
TUBING INSUFFLATOR HI FLOW (MISCELLANEOUS) ×3 IMPLANT

## 2014-09-20 NOTE — Anesthesia Preprocedure Evaluation (Signed)
Anesthesia Evaluation  Patient identified by MRN, date of birth, ID band Patient awake    Reviewed: Allergy & Precautions, NPO status , Patient's Chart, lab work & pertinent test results, reviewed documented beta blocker date and time   Airway Mallampati: II  TM Distance: >3 FB     Dental  (+) Chipped   Pulmonary Current Smoker,           Cardiovascular      Neuro/Psych PSYCHIATRIC DISORDERS Schizophrenia    GI/Hepatic   Endo/Other    Renal/GU      Musculoskeletal   Abdominal   Peds  Hematology   Anesthesia Other Findings   Reproductive/Obstetrics                             Anesthesia Physical Anesthesia Plan  ASA: II  Anesthesia Plan: General   Post-op Pain Management:    Induction: Intravenous  Airway Management Planned: Oral ETT  Additional Equipment:   Intra-op Plan:   Post-operative Plan:   Informed Consent: I have reviewed the patients History and Physical, chart, labs and discussed the procedure including the risks, benefits and alternatives for the proposed anesthesia with the patient or authorized representative who has indicated his/her understanding and acceptance.     Plan Discussed with: CRNA  Anesthesia Plan Comments:         Anesthesia Quick Evaluation

## 2014-09-20 NOTE — Progress Notes (Signed)
Patient ID: [name], male   DOB: 04-04-1970, 44 y.o.   MRN: [id number]   Surgery progress note.  The patient is without complaint.  Filed Vitals:   09/19/14 1555 09/19/14 2005 09/20/14 0033 09/20/14 0746  BP: 121/80  109/60 139/89  Pulse: 72  58 79  Temp: 97.5 F (36.4 C)  97.9 F (36.6 C) 97.9 F (36.6 C)  TempSrc: Oral  Oral Oral  Resp: Height:      Weight:      SpO2: 97% 98% 97% 97%    His abdomen is soft and nontender.  CMP Latest Ref Rng 09/20/2014 09/19/2014 09/18/2014  Glucose 65 - 99 mg/dL 85 94 469(G)  BUN 6 - 20 mg/dL 5(L) <2(X) <5(M)  Creatinine 0.61 - 1.24 mg/dL 8.41 3.24 4.01  Sodium 135 - 145 mmol/L 143 142 140  Potassium 3.5 - 5.1 mmol/L 3.7 3.4(L) 3.3(L)  Chloride 101 - 111 mmol/L 110 105 102  CO2 22 - 32 mmol/L Calcium 8.9 - 10.3 mg/dL 0.2(V) 9.0 9.2  Total Protein 6.5 - 8.1 g/dL 6.2(L) 7.4 8.7(H)  Total Bilirubin 0.3 - 1.2 mg/dL 0.8 2.4(H) 3.0(H)  Alkaline Phos 38 - 126 U/L 373(H) 526(H) 581(H)  AST 15 - 41 U/L 67(H) 160(H) 172(H)  ALT 17 - 63 U/L 108(H) 180(H) 190(H)    Impression cholelithiasis and choledocholithiasis.  Plan I discussed with him laparoscopic cholecystectomy yesterday in the afternoon the small risk of needing an open operation the risk of bile duct injury being a 1 and 200 cases. The possibility of a postoperative ERCP be needed based on the cholangiography findings. All of his questions were answered and wished to proceed later this morning.

## 2014-09-20 NOTE — Transfer of Care (Signed)
Immediate Anesthesia Transfer of Care Note  Patient: [name]  Procedure(s) Performed: Procedure(s): LAPAROSCOPIC CHOLECYSTECTOMY (N/A)  Patient Location: PACU  Anesthesia Type:General  Level of Consciousness: awake, alert  and oriented  Airway & Oxygen Therapy: Patient Spontanous Breathing  Post-op Assessment: Report given to RN  Post vital signs: stable  Last Vitals:  Filed Vitals:   09/20/14 1326  BP: 121/76  Pulse:   Temp: 36.8 C  Resp: 16    Complications: No apparent anesthesia complications

## 2014-09-20 NOTE — Op Note (Signed)
09/19/2014 - 09/20/2014  1:32 PM  PATIENT:  [name]  44 y.o. male  PRE-OPERATIVE DIAGNOSIS:  Choledocholithiasis, cholecystitis  POST-OPERATIVE DIAGNOSIS:  Same.  PROCEDURE:  Laparoscopic cholecystectomy with intraoperative cholangiography.  SURGEON:  Surgeon(s) and Role:    * Natale Lay, MD - Primary  ASSISTANTS: tech  ANESTHESIA: GETA     SPECIMEN:Gallbladder and contents    EBL: minimal.   findings cholelithiasis.    Cholangiography demonstrates choledocholithiasis.  Description of procedure:     With informed consent supine position general endotracheal anesthesia the patient's abdomen was clipped of hair sterilely prepped and draped core prep solution and a time out was observed.   A 12 mm blunt Hassan trocar was placed via an open technique through an infraumbilical transversely oriented skin incision. Stay sutures being passed to the fascia. Pneumoperitoneum established. The patient was positioned in reverse Trendelenburg and airplane right side up. 5 mm bladeless trochars were placed in the epigastrium and 2 in the ight lateral abdomen. The gallbladder was placed on tension and adhesions of the omentum were taken down off the body of the gallbladder with point after cautery and blunt dissection. Lateral traction was applied to Hartman's pouch. Dissection of the hepatoduodenal ligament liberated a cystic duct and single cystic artery. The cystic artery was doubly clipped on the portal side singly clipped on gallbladder side and divided. Kumar catheter was then placed into the gallbladder and cholangiography demonstrated the above-noted findings. There appeared to be a small amount of stones within the cystic duct and as such an occluding clip was placed on the gallbladder neck.  The cystic duct was partially opened transversely with sharp scissors and the cystic duct contents were milked proximally.     The gallbladder was then retrieved off the gallbladder fossa with 3 clips  being placed on the portal side of the cystic duct in the duct been then completely divided.   The specimen was captured in an Endo Catch device and retrieved. Pneumoperitoneum was then reestablished. Inspection of the gallbladder fossa demonstrated no clear bleeding but there was a miniscule 2 mm accessory duct on the right lateral midportion of the gallbladder fossa which was dissected out and divided between hemoclips. Hemostasis was obtained with point cautery. Because of the retained common bile duct stone and the small accessory duct been occluded in the gallbladder fossa a 19 mm Blake drain was directed into the Morison's pouch and exited the right lower quadrant 5 mm trocar site.    There appeared to be no further bile drainage from the gallbladder fossa at this point. Hemostasis appeared to be adequate. Ports were then removed under direct visualization. A total of 20 cc of 0.25% plain Marcaine was infiltrated along all skin and fascial incisions prior to closure.  JP drain was secured at the skin site with 2 #3-0 nylon sutures. Umbilical fascial defect was re-approximated with an additional figure-of-eight #0 Vicryl suture placed in vertical orientation. Existing stay sutures being tied to each other. 4-0 Vicryl suture placed in subcuticular fashion to re-approximate skin edges followed application of benzoin Steri-Strips Telfa and Tegaderm. The patient was then extubated to the recovery room in stable and satisfactory condition by anesthesia services.

## 2014-09-20 NOTE — Anesthesia Procedure Notes (Signed)
Procedure Name: Intubation Date/Time: 09/20/2014 12:05 PM Performed by: [id number],  [place] Pre-anesthesia Checklist: Patient identified, Emergency Drugs available, Timeout performed, Patient being monitored and Suction available Oxygen Delivery Method: Circle system utilized Preoxygenation: Pre-oxygenation with 100% oxygen Intubation Type: IV induction Laryngoscope Size: Mac and 3 Grade View: Grade II Tube type: Oral Number of attempts: 1 Placement Confirmation: ETT inserted through vocal cords under direct vision,  breath sounds checked- equal and bilateral,  CO2 detector and positive ETCO2 Secured at: 22 cm Tube secured with: Tape

## 2014-09-21 NOTE — Progress Notes (Signed)
GI Note:  Full consult note to follow.    He has evidence of choledocholithiasis on IOC.   Will plan for ERCP tomorrow with Dr Bluford Kaufmann or Servando Snare if either is available.  NPO after mn. NO am anti-coag.

## 2014-09-21 NOTE — Progress Notes (Signed)
Patient ID: [name], male   DOB: 06-19-1970, 44 y.o.   MRN: [id number]   Postoperative day #1.  Status post lap scopic cholecystectomy with intraoperative cholangiography.  Intraoperative cholangiography demonstrated a retained common bile duct stone.  The patient is complaining of some mild right upper quadrant abdominal pain with deep inspiration likely secondary to the drain.  Filed Vitals:   09/20/14 1945 09/20/14 2311 09/21/14 0603 09/21/14 0742  BP:  118/79 132/77   Pulse:  73 62   Temp:   98.5 F (36.9 C)   TempSrc:   Oral   Resp:  16 18   Height:      Weight:      SpO2: 97% 94% 91% 94%    I/O last 3 completed shifts: In: 6413.7 [P.O.:240; I.V.:6173.7] Out: 1730 [Urine:1575; Drains:105; Blood:50] Total I/O In: 240 [P.O.:240] Out: 300 [Urine:300]  On physical examination he is alert and oriented. Jackson-Pratt drain is nonbilious in nature. His abdomen is soft and nontender.  Impression retained common bile duct stone despite negative MRCP and improving laboratory values.  Plan he will need an ERCP with sphincterotomy stone extraction. If a duct of Luschka leak is noted he will need his biliary stent.

## 2014-09-21 NOTE — Consult Note (Signed)
GI Inpatient Consult Note  Reason for Consult: choledocholithiasis   Attending Requesting Consult: Dr Egbert Garibaldi  History of Present Illness:  [name] is a 44 y.o. male who presented to ED on 9/9 with acute on chronic RUQ pain.  Had been ongoing for several months with some worsening the day of presentation. Pain acompanied by nausea. Unsure if worse after eating.  Did not have f/c, rectal bleeding, melena, weight loss..   U/s showed gallstones and had mild elevated liver enzyems.  Went for CCY on 09/20/14 and IOC showed CBD stone.  GI consulted for ERCP.   Pain much improved post CCY. Tolerating diet.  No n/v, no f/c.  Some mild pain near surgical sites.     iver enzymes are trending down.    Past Medical History:  Past Medical History  Diagnosis Date  . Schizophrenia   . Hypercholesteremia     Problem List: Patient Active Problem List   Diagnosis Date Noted  . Abdominal pain, acute   . Hyperbilirubinemia   . Choledocholithiasis 09/19/2014  . Transaminitis     Past Surgical History: History reviewed. No pertinent past surgical history.  Allergies: No Known Allergies  Home Medications: Prescriptions prior to admission  Medication Sig Dispense Refill Last Dose  . albuterol (PROVENTIL HFA;VENTOLIN HFA) 108 (90 BASE) MCG/ACT inhaler Inhale 2 puffs into the lungs every 4 (four) hours as needed for wheezing or shortness of breath.   Past Month at Unknown time  . atorvastatin (LIPITOR) 40 MG tablet Take 40 mg by mouth daily.   09/17/2014 at Unknown time  . benztropine (COGENTIN) 0.5 MG tablet Take 0.5 mg by mouth 2 (two) times daily.   09/18/2014 at Unknown time  . cholecalciferol (VITAMIN D) 1000 UNITS tablet Take 1,000 Units by mouth 2 (two) times daily.    09/18/2014 at Unknown time  . esomeprazole (NEXIUM) 40 MG capsule Take 40 mg by mouth daily.    09/18/2014 at Unknown time  . fluticasone (FLOVENT HFA) 110 MCG/ACT inhaler Inhale 2 puffs into the lungs 2 (two) times daily.   Past Month  at Unknown time  . gabapentin (NEURONTIN) 400 MG capsule Take 400 mg by mouth 3 (three) times daily.   09/18/2014 at Unknown time  . haloperidol (HALDOL) 5 MG tablet Take 5 mg by mouth 2 (two) times daily.   09/18/2014 at Unknown time  . hydrOXYzine (VISTARIL) 50 MG capsule Take 50 mg by mouth 4 (four) times daily as needed for anxiety (agitation. Limit 4 caps per 24 hours.).   09/18/2014 at Unknown time  . LORazepam (ATIVAN) 1 MG tablet Take 1 mg by mouth 2 (two) times daily.    09/18/2014 at Unknown time  . OLANZapine (ZYPREXA) 15 MG tablet Take 15 mg by mouth 2 (two) times daily.   09/18/2014 at Unknown time  . ranitidine (ZANTAC) 150 MG capsule Take 150 mg by mouth 2 (two) times daily.   09/18/2014 at Unknown time   Home medication reconciliation was completed with the patient.   Scheduled Inpatient Medications:   . atorvastatin  40 mg Oral Daily  . benztropine  0.5 mg Oral BID  . budesonide  0.25 mg Nebulization BID  . famotidine  20 mg Oral Daily  . gabapentin  400 mg Oral TID  . haloperidol  5 mg Oral BID  . heparin  5,000 Units Subcutaneous 3 times per day  . LORazepam  1 mg Oral BID  . OLANZapine  15 mg Oral BID  .  pantoprazole  40 mg Oral Daily    Continuous Inpatient Infusions:   . lactated ringers 100 mL/hr at 09/21/14 1143    PRN Inpatient Medications:  albuterol, HYDROcodone-acetaminophen, hydrOXYzine, hydrOXYzine, morphine injection, nicotine, ondansetron **OR** ondansetron (ZOFRAN) IV  Family History: The patient's family history is negative for inflammatory bowel disorders, GI malignancy, or solid organ transplantation.  Social History:   reports that he has been smoking Cigarettes.  He has been smoking about 1.00 pack per day. He does not have any smokeless tobacco history on file. He reports that he does not drink alcohol.   Review of Systems: Constitutional: Weight is stable.  Eyes: No changes in vision. ENT: No oral lesions, sore throat.  GI: see HPI.  Heme/Lymph:  No easy bruising.  CV: No chest pain.  GU: No hematuria.  Integumentary: No rashes.  Neuro: No headaches.  Psych: + depression/anxiety.  Endocrine: No heat/cold intolerance.  Allergic/Immunologic: No urticaria.  Resp: No cough, SOB.  Musculoskeletal: No joint swelling.    Physical Examination: BP 136/80 mmHg  Pulse 56  Temp(Src) 98.7 F (37.1 C) (Oral)  Resp 16  Ht  (1.778 m)  Wt 64.91 kg (143 lb 1.6 oz)  BMI 20.53 kg/m2  SpO2 93% Gen: NAD, alert and oriented x 4 HEENT: PEERLA, EOMI, Neck: supple, no JVD or thyromegaly Chest: CTA bilaterally, no wheezes, crackles, or other adventitious sounds CV: RRR, no m/g/c/r Abd: soft,+ mild diffuse TTP, surgicall sites c/d/i, ND, +BS in all four quadrants; no HSM, guarding, ridigity, or rebound tenderness Ext: no edema, well perfused with 2+ pulses, Skin: no rash or lesions noted Lymph: no LAD  Data: Lab Results  Component Value Date   WBC 10.0 09/19/2014   HGB 15.6 09/19/2014   HCT 45.3 09/19/2014   MCV 90.7 09/19/2014   PLT 235 09/19/2014    Recent Labs Lab 09/18/14 2039 09/19/14 0427  HGB 16.6 15.6   Lab Results  Component Value Date   NA 143 09/20/2014   K 3.7 09/20/2014   CL 110 09/20/2014   CO2 28 09/20/2014   BUN 5* 09/20/2014   CREATININE 0.84 09/20/2014   Lab Results  Component Value Date   ALT 108* 09/20/2014   AST 67* 09/20/2014   ALKPHOS 373* 09/20/2014   BILITOT 0.8 09/20/2014   No results for input(s): APTT, INR, PTT in the last 168 hours.   Assessment/Plan: [name] is a 44 y.o. male s/p CCY for RUQ pain and gallstones, with CBD stone on IOC.   Recommendations: - will plan for ERCP, on MOnday if Dr Bluford Kaufmann or Servando Snare available.  - npo mn - no am anti-coag - folow daily liver enzymes - watch for s/s cholangitis.   Thank you for the consult. Please call with questions or concerns.  REIN, Addison Naegeli, MD

## 2014-09-22 ENCOUNTER — Encounter
Admission: EM | Disposition: A | Discharge status: Home / Self Care | Payer: Self-pay | Source: Home / Self Care | Attending: Surgery

## 2014-09-22 ENCOUNTER — Inpatient Hospital Stay: Payer: Medicare Other | Admitting: Anesthesiology

## 2014-09-22 ENCOUNTER — Inpatient Hospital Stay: Payer: Medicare Other

## 2014-09-22 ENCOUNTER — Encounter: Payer: Self-pay | Admitting: Surgery

## 2014-09-22 HISTORY — PX: ENDOSCOPIC RETROGRADE CHOLANGIOPANCREATOGRAPHY (ERCP) WITH PROPOFOL: SHX5810

## 2014-09-22 LAB — COMPREHENSIVE METABOLIC PANEL
ALT: 66 U/L — ABNORMAL HIGH (ref 17–63)
ANION GAP: 7 (ref 5–15)
AST: 41 U/L (ref 15–41)
Albumin: 3 g/dL — ABNORMAL LOW (ref 3.5–5.0)
Alkaline Phosphatase: 274 U/L — ABNORMAL HIGH (ref 38–126)
BILIRUBIN TOTAL: 1.1 mg/dL (ref 0.3–1.2)
BUN: 5 mg/dL — AB (ref 6–20)
CO2: 25 mmol/L (ref 22–32)
Calcium: 8.3 mg/dL — ABNORMAL LOW (ref 8.9–10.3)
Chloride: 107 mmol/L (ref 101–111)
Creatinine, Ser: 0.89 mg/dL (ref 0.61–1.24)
GFR calc Af Amer: >60 mL/min (ref >60)
Glucose, Bld: 91 mg/dL (ref 65–99)
POTASSIUM: 3.3 mmol/L — AB (ref 3.5–5.1)
Sodium: 139 mmol/L (ref 135–145)
TOTAL PROTEIN: 6.6 g/dL (ref 6.5–8.1)

## 2014-09-22 LAB — CBC
HEMATOCRIT: 40.1 % (ref 40.0–52.0)
Hemoglobin: 13.6 g/dL (ref 13.0–18.0)
MCH: 30.7 pg (ref 26.0–34.0)
MCHC: 33.8 g/dL (ref 32.0–36.0)
MCV: 90.8 fL (ref 80.0–100.0)
Platelets: 216 10*3/uL (ref 150–440)
RBC: 4.42 MIL/uL (ref 4.40–5.90)
RDW: 14.8 % — AB (ref 11.5–14.5)
WBC: 16 10*3/uL — ABNORMAL HIGH (ref 3.8–10.6)

## 2014-09-22 SURGERY — ERCP, WITH INTERVENTION IF INDICATED
Anesthesia: General | Laterality: Left

## 2014-09-22 SURGERY — ENDOSCOPIC RETROGRADE CHOLANGIOPANCREATOGRAPHY (ERCP) WITH PROPOFOL
Anesthesia: General

## 2014-09-22 MED ORDER — FENTANYL CITRATE (PF) 100 MCG/2ML IJ SOLN
INTRAMUSCULAR | Status: DC | PRN
  Administered 2014-09-22 (×2): 50 ug via INTRAVENOUS

## 2014-09-22 MED ORDER — CEFAZOLIN (ANCEF) 1 G IV SOLR: 1.0000 g | INTRAVENOUS | Status: DC

## 2014-09-22 MED ORDER — ONDANSETRON HCL 4 MG/2ML IJ SOLN: 4.0000 mg | Freq: Once | INTRAMUSCULAR | Status: DC | PRN

## 2014-09-22 MED ORDER — CEFAZOLIN SODIUM 1-5 GM-% IV SOLN
1.0000 g | INTRAVENOUS | Status: DC
  Filled 2014-09-22: qty 50

## 2014-09-22 MED ORDER — MIDAZOLAM HCL 2 MG/2ML IJ SOLN
INTRAMUSCULAR | Status: DC | PRN
  Administered 2014-09-22: 2 mg via INTRAVENOUS

## 2014-09-22 MED ORDER — SUCCINYLCHOLINE CHLORIDE 20 MG/ML IJ SOLN
INTRAMUSCULAR | Status: DC | PRN
  Administered 2014-09-22: 90 mg via INTRAVENOUS

## 2014-09-22 MED ORDER — PROPOFOL 10 MG/ML IV BOLUS
INTRAVENOUS | Status: DC | PRN
Start: 2014-09-22 — End: 2014-09-22
  Administered 2014-09-22: 180 mg via INTRAVENOUS

## 2014-09-22 MED ORDER — FENTANYL CITRATE (PF) 100 MCG/2ML IJ SOLN
25.0000 ug | INTRAMUSCULAR | Status: DC | PRN
  Administered 2014-09-22 (×4): 25 ug via INTRAVENOUS

## 2014-09-22 NOTE — Anesthesia Preprocedure Evaluation (Addendum)
Anesthesia Evaluation  Patient identified by MRN, date of birth, ID band Patient awake    Reviewed: Allergy & Precautions, NPO status , Patient's Chart, lab work & pertinent test results  History of Anesthesia Complications Negative for: history of anesthetic complications  Airway Mallampati: II  TM Distance: >3 FB Neck ROM: Full    Dental  (+) Teeth Intact   Pulmonary asthma , Current Smoker,           Cardiovascular      Neuro/Psych Schizophrenia    GI/Hepatic GERD  Medicated,  Endo/Other    Renal/GU      Musculoskeletal   Abdominal   Peds  Hematology   Anesthesia Other Findings   Reproductive/Obstetrics                            Anesthesia Physical Anesthesia Plan  ASA: II  Anesthesia Plan: General   Post-op Pain Management:    Induction: Intravenous and Rapid sequence  Airway Management Planned: Oral ETT  Additional Equipment:   Intra-op Plan:   Post-operative Plan:   Informed Consent: I have reviewed the patients History and Physical, chart, labs and discussed the procedure including the risks, benefits and alternatives for the proposed anesthesia with the patient or authorized representative who has indicated his/her understanding and acceptance.     Plan Discussed with:   Anesthesia Plan Comments:         Anesthesia Quick Evaluation

## 2014-09-22 NOTE — Op Note (Signed)
Please see ERCP report. Small ampulla. Very difficult cannulation of CBD. Because repeated cannulation of PD instead, temporary PD stent placed 1st with good drainage. Cannulation of CBD still not possible with guidewire. Unfortunately, needle knife not available in endo to successfully cannulate CBD. Elected to stop. Keep pt NPO rest of today. Watch for pancreatitis. Will order needle knife. If pt improves clinically, perhaps pt can be discharged to home in few days and have ERCP arranged as outpt at [facility]. Discussed case with Dr. Shelle Iron. Thanks.

## 2014-09-22 NOTE — Op Note (Addendum)
[facility] Patient Name: [name] Procedure Date: 09/22/2014 11:11 AM MRN: [id number] Account #: [id number] Date of Birth: 10/29/1970 Admit Type: Inpatient Age: 44 Room: [facility] ENDO ROOM 4 Gender: Male Note Status: Supervisor Override Procedure:         ERCP Indications:       Filling defect on intraoperative cholangiogram, Abnormal                     liver function test Providers:         Ezzard Standing. Bluford Kaufmann, MD Medicines:         General Anesthesia Complications:     No immediate complications. Procedure:         Pre-Anesthesia Assessment:                    - Prior to the procedure, a History and Physical was                     performed, and patient medications, allergies and                     sensitivities were reviewed. The patient's tolerance of                     previous anesthesia was reviewed.                    - The risks and benefits of the procedure and the sedation                     options and risks were discussed with the patient. All                     questions were answered and informed consent was obtained.                    - After reviewing the risks and benefits, the patient was                     deemed in satisfactory condition to undergo the procedure.                    After obtaining informed consent, the scope was passed                     under direct vision. Throughout the procedure, the                     patient's blood pressure, pulse, and oxygen saturations                     were monitored continuously. The ERCP was introduced                     through the mouth, and used to inject contrast into and                     used to inject contrast into the ventral pancreatic duct.                     The ERCP was technically difficult and complex due to                     challenging cannulation. The patient tolerated the  procedure well. The patient tolerated the procedure  well. Findings:      The scout film was normal. The esophagus was successfully intubated       under direct vision. The scope was advanced to a normal but small major       papilla in the descending duodenum without detailed examination of the       pharynx, larynx and associated structures, and upper GI tract. The upper       GI tract was grossly normal. A straight Roadrunner wire was passed into       the pancreatic duct. The short-nosed traction sphincterotome was passed       over the guidewire and the pancreatic duct was then deeply cannulated.       Contrast was not injected. I personally interpreted the pancreatic duct       images. To reduce risk of pancreatitis, 5 Fr x 3 cm pancreatic stent was       placed with good drainage of pancreatic juice. I then spent lot of time       getting wire around the stent to access CBD without any success. Each       time, guidewire went up the pancreatic duct. I planned to do needle       knife sphincterotomy to access the bile duct. Unfortunately, needle       knife not available in endo. Elected to stop after multiple attmpts. Impression:        - No specimens collected. Recommendation:    - Observe patient's clinical course.                    - The findings and recommendations were discussed with the                     patient.                    - Watch for pancreatitis.                    - NPO today. Procedure Code(s): --- Professional ---                    [phone], Endoscopic retrograde cholangiopancreatography                     (ERCP); diagnostic, including collection of specimen(s) by                     brushing or washing, when performed (separate procedure) Diagnosis Code(s): --- Professional ---                    R94.5, Abnormal results of liver function studies                    R93.2, Abnormal findings on diagnostic imaging of liver                     and biliary tract CPT copyright 2014 American [employer] Association. All  rights reserved. The codes documented in this report are preliminary and upon coder review may  be revised to meet current compliance requirements. Wallace Cullens, MD 09/22/2014 1:16:15 PM This report has been signed electronically. Number of Addenda: 0 Note Initiated On: 09/22/2014 11:11 AM      [facility]

## 2014-09-22 NOTE — Anesthesia Procedure Notes (Addendum)
Procedure Name: Intubation Date/Time: 09/22/2014 12:37 PM Performed by: Naomie Dean Pre-anesthesia Checklist: Patient identified Patient Re-evaluated:Patient Re-evaluated prior to inductionOxygen Delivery Method: Circle system utilized Preoxygenation: Pre-oxygenation with 100% oxygen Intubation Type: IV induction, Cricoid Pressure applied and Rapid sequence Ventilation: Mask ventilation without difficulty Laryngoscope Size: Mac and 3 Grade View: Grade II Tube type: Oral Tube size: 7.5 mm Number of attempts: 1 Airway Equipment and Method: Stylet Placement Confirmation: ETT inserted through vocal cords under direct vision and positive ETCO2 Secured at: 22 cm Tube secured with: Tape

## 2014-09-22 NOTE — Progress Notes (Signed)
[name] is a 44 y.o. male patient.  POD#2 from Lap chole with IOC and retained CBD stone.  Patient states still some intermittent RUQ pain and pain along the drain site.  Patient denies any nausea or vomiting.  He has been passing flatus but no BM.   1. Calculus of gallbladder without cholecystitis without obstruction   2. Transaminitis   3. Abdominal pain, acute   4. Hyperbilirubinemia   5. Biliary colic     Past Medical History  Diagnosis Date  . Schizophrenia   . Hypercholesteremia     Current Facility-Administered Medications  Medication Dose Route Frequency Provider Last Rate Last Dose  . albuterol (PROVENTIL) (2.5 MG/3ML) 0.083% nebulizer solution 3 mL  3 mL Inhalation Q4H PRN Natale Lay, MD      . atorvastatin (LIPITOR) tablet 40 mg  40 mg Oral Daily Natale Lay, MD   40 mg at 09/21/14 1002  . benztropine (COGENTIN) tablet 0.5 mg  0.5 mg Oral BID Natale Lay, MD   0.5 mg at 09/21/14 2242  . budesonide (PULMICORT) nebulizer solution 0.25 mg  0.25 mg Nebulization BID Natale Lay, MD   0.25 mg at 09/22/14 0753  . ceFAZolin (ANCEF) IVPB 1 g/50 mL premix  1 g Intravenous To OR Natale Lay, MD      . famotidine (PEPCID) tablet 20 mg  20 mg Oral Daily Natale Lay, MD   20 mg at 09/21/14 1002  . gabapentin (NEURONTIN) capsule 400 mg  400 mg Oral TID Natale Lay, MD   400 mg at 09/21/14 2243  . haloperidol (HALDOL) tablet 5 mg  5 mg Oral BID Natale Lay, MD   5 mg at 09/21/14 2243  . HYDROcodone-acetaminophen (NORCO/VICODIN) 5-325 MG per tablet 1-2 tablet  1-2 tablet Oral Q4H PRN Lattie Haw, MD   2 tablet at 09/22/14 0252  . hydrOXYzine (ATARAX/VISTARIL) tablet 50 mg  50 mg Oral QID PRN Lattie Haw, MD      . hydrOXYzine (VISTARIL) capsule 50 mg  50 mg Oral QID PRN Natale Lay, MD   50 mg at 09/19/14 1423  . lactated ringers infusion   Intravenous Continuous Natale Lay, MD 100 mL/hr at 09/22/14 0249    . LORazepam (ATIVAN) tablet 1 mg  1 mg Oral BID Natale Lay, MD   1 mg at 09/21/14 2243  .  morphine 2 MG/ML injection 2 mg  2 mg Intravenous Q2H PRN Lattie Haw, MD   2 mg at 09/21/14 2039  . nicotine (NICOTROL) 10 MG inhaler 1 continuous puffing  1 continuous puffing Inhalation PRN Natale Lay, MD   1 continuous puffing at 09/21/14 2040  . OLANZapine (ZYPREXA) tablet 15 mg  15 mg Oral BID Natale Lay, MD   15 mg at 09/21/14 2242  . ondansetron (ZOFRAN) tablet 4 mg  4 mg Oral Q6H PRN Lattie Haw, MD       Or  . ondansetron [facility]) injection 4 mg  4 mg Intravenous Q6H PRN Lattie Haw, MD      . pantoprazole (PROTONIX) EC tablet 40 mg  40 mg Oral Daily Natale Lay, MD   40 mg at 09/21/14 1003   No Known Allergies Active Problems:   Choledocholithiasis   Abdominal pain, acute   Hyperbilirubinemia  Blood pressure 120/70, pulse 83, temperature 98.5 F (36.9 C), temperature source Oral, resp. rate 16, height 5\' 10"  (1.778 m), weight 143 lb 1.6 oz (64.91 kg), SpO2 94 %.  Review of Systems  Constitutional: Negative for fever and chills.  Gastrointestinal: Positive for abdominal pain. Negative for nausea and vomiting.  Genitourinary: Negative for dysuria and urgency.  Neurological: Negative for weakness.    Physical Exam  Constitutional: He is oriented to person, place, and time. He appears well-developed and well-nourished. No distress.  Cardiovascular: Normal rate and regular rhythm.   Pulmonary/Chest: Effort normal and breath sounds normal. No respiratory distress.  Abdominal: Soft. He exhibits no distension.  Incision sites c/d/i, JP with 50cc serosanginous drainage, no evidence of bile  Musculoskeletal: Normal range of motion. He exhibits no edema.  Neurological: He is alert and oriented to person, place, and time.  Vitals reviewed.  Labs:  CBC Latest Ref Rng 09/22/2014 09/19/2014 09/18/2014  WBC 3.8 - 10.6 K/uL 16.0(H) 10.0 13.4(H)  Hemoglobin 13.0 - 18.0 g/dL 16.1 09.6 04.5  Hematocrit 40.0 - 52.0 % 40.1 45.3 48.6  Platelets 150 - 440 K/uL 216 235 265    CMP  Latest Ref Rng 09/22/2014 09/20/2014 09/19/2014  Glucose 65 - 99 mg/dL 91 85 94  BUN 6 - 20 mg/dL 5(L) 5(L) <4(U)  Creatinine 0.61 - 1.24 mg/dL 9.81 1.91 4.78  Sodium 135 - 145 mmol/L 139 143 142  Potassium 3.5 - 5.1 mmol/L 3.3(L) 3.7 3.4(L)  Chloride 101 - 111 mmol/L 107 110 105  CO2 22 - 32 mmol/L Calcium 8.9 - 10.3 mg/dL 8.3(L) 8.5(L) 9.0  Total Protein 6.5 - 8.1 g/dL 6.6 6.2(L) 7.4  Total Bilirubin 0.3 - 1.2 mg/dL 1.1 0.8 2.4(H)  Alkaline Phos 38 - 126 U/L 274(H) 373(H) 526(H)  AST 15 - 41 U/L 41 67(H) 160(H)  ALT 17 - 63 U/L 66(H) 108(H) 180(H)    Assessment/Plan:POD#2 Lap chole with IOC GI consulted for ERCP today Will advance diet as tolerates after procedure   Santina Evans L Anaija Wissink 09/22/2014

## 2014-09-22 NOTE — Anesthesia Postprocedure Evaluation (Signed)
  Anesthesia Post-op Note  Patient: [name]  Procedure(s) Performed: Procedure(s): ENDOSCOPIC RETROGRADE CHOLANGIOPANCREATOGRAPHY (ERCP) WITH PROPOFOL (N/A)  Anesthesia type:General  Patient location: PACU  Post pain: Pain level controlled  Post assessment: Post-op Vital signs reviewed, Patient's Cardiovascular Status Stable, Respiratory Function Stable, Patent Airway and No signs of Nausea or vomiting  Post vital signs: Reviewed and stable  Last Vitals:  Filed Vitals:   09/22/14 1326  BP: 140/83  Pulse: 82  Temp: 36.6 C  Resp:     Level of consciousness: awake, alert  and patient cooperative  Complications: No apparent anesthesia complications

## 2014-09-22 NOTE — Transfer of Care (Signed)
Immediate Anesthesia Transfer of Care Note  Patient: [name]  Procedure(s) Performed: Procedure(s): ENDOSCOPIC RETROGRADE CHOLANGIOPANCREATOGRAPHY (ERCP) WITH PROPOFOL (N/A)  Patient Location: PACU  Anesthesia Type:General  Level of Consciousness: sedated  Airway & Oxygen Therapy: Patient connected to face mask oxygen  Post-op Assessment: Report given to RN  Post vital signs: stable  Last Vitals:  Filed Vitals:   09/22/14 1128  BP: 139/81  Pulse: 66  Temp: 37.1 C  Resp: 17    Complications: No apparent anesthesia complications

## 2014-09-22 NOTE — Care Management Important Message (Signed)
Important Message  Patient Details  Name: [name] MRN: [id number] Date of Birth: 08-Nov-1970   Medicare Important Message Given:  Yes-second notification given    Berna Bue, RN 09/22/2014, 10:22 AM

## 2014-09-23 ENCOUNTER — Encounter: Payer: Self-pay | Admitting: Gastroenterology

## 2014-09-23 LAB — COMPREHENSIVE METABOLIC PANEL
ALT: 47 U/L (ref 17–63)
AST: 32 U/L (ref 15–41)
Albumin: 2.9 g/dL — ABNORMAL LOW (ref 3.5–5.0)
Alkaline Phosphatase: 251 U/L — ABNORMAL HIGH (ref 38–126)
Anion gap: 10 (ref 5–15)
BUN: 6 mg/dL (ref 6–20)
CHLORIDE: 106 mmol/L (ref 101–111)
CO2: 22 mmol/L (ref 22–32)
Calcium: 8.2 mg/dL — ABNORMAL LOW (ref 8.9–10.3)
Creatinine, Ser: 0.87 mg/dL (ref 0.61–1.24)
Glucose, Bld: 68 mg/dL (ref 65–99)
POTASSIUM: 3.5 mmol/L (ref 3.5–5.1)
SODIUM: 138 mmol/L (ref 135–145)
Total Bilirubin: 1.3 mg/dL — ABNORMAL HIGH (ref 0.3–1.2)
Total Protein: 6.4 g/dL — ABNORMAL LOW (ref 6.5–8.1)

## 2014-09-23 LAB — SURGICAL PATHOLOGY

## 2014-09-23 LAB — LIPASE, BLOOD: LIPASE: 32 U/L (ref 22–51)

## 2014-09-23 MED ORDER — HEPARIN SODIUM (PORCINE) 5000 UNIT/ML IJ SOLN
5000.0000 [IU] | Freq: Three times a day (TID) | INTRAMUSCULAR | Status: DC
  Administered 2014-09-23 – 2014-09-26 (×10): 5000 [IU] via SUBCUTANEOUS
  Filled 2014-09-23 (×10): qty 1

## 2014-09-23 NOTE — Anesthesia Postprocedure Evaluation (Signed)
  Anesthesia Post-op Note  Patient: [name]  Procedure(s) Performed: Procedure(s): LAPAROSCOPIC CHOLECYSTECTOMY (N/A)  Anesthesia type:General  Patient location: PACU  Post pain: Pain level controlled  Post assessment: Post-op Vital signs reviewed, Patient's Cardiovascular Status Stable, Respiratory Function Stable, Patent Airway and No signs of Nausea or vomiting  Post vital signs: Reviewed and stable  Last Vitals:  Filed Vitals:   09/23/14 1201  BP: 126/81  Pulse: 93  Temp: 36.9 C  Resp: 17    Level of consciousness: awake, alert  and patient cooperative  Complications: No apparent anesthesia complications

## 2014-09-23 NOTE — Care Management Note (Signed)
Case Management Note  Patient Details  Name: Altus Zaino MRN: [id number] Date of Birth: [birth year]  Subjective/Objective:  Spoke to pt. Harless Nakayama. She states she is following the pt. By phone due to distance. She does confirm the pt stays out on day visits  When she can get him. She herself requires "right much attention". Since her husband died recently she is not able to get around. I have told her I will call with an update tomorrow once we see how the pt. Tolerates his diet advancing.                  Action/Plan:   Expected Discharge Date:                  Expected Discharge Plan:     In-House Referral:     Discharge planning Services     Post Acute Care Choice:    Choice offered to:     DME Arranged:    DME Agency:     HH Arranged:    HH Agency:     Status of Service:     Medicare Important Message Given:  Yes-second notification given Date Medicare IM Given:    Medicare IM give by:    Date Additional Medicare IM Given:    Additional Medicare Important Message give by:     If discussed at Long Length of Stay Meetings, dates discussed:    Additional Comments:  Berna Bue, RN 09/23/2014, 2:27 PM

## 2014-09-23 NOTE — Care Management Note (Signed)
Case Management Note  Patient Details  Name: [name] MRN: [id number] Date of Birth: [birth year]  Subjective/Objective:      This young man is here post op complications, and normally resides in [facility]. I spoke to darren, from the home earlier today, and verified that the pt. Has a guardian, who is actually an elderly aunt. She lives in [place], but visits regularly.She apparently also takes the pt. Periodically for home visits. At the group home, he is able to wash and dress himself with minimal assist. They provide his meals, and he is able to feed himself.Toileting is normally independent also.  The pt. Is very happy to be feeling better, and says the full liquid lunch was very good. He is looking forward to the diet progressing.  He is able to explain to me that he is going to be discharged once diet is tolerated, and he will return for ERCP as an outpatient.    Will follow.           Action/Plan:   Expected Discharge Date:                  Expected Discharge Plan:     In-House Referral:     Discharge planning Services     Post Acute Care Choice:    Choice offered to:     DME Arranged:    DME Agency:     HH Arranged:    HH Agency:     Status of Service:     Medicare Important Message Given:  Yes-second notification given Date Medicare IM Given:    Medicare IM give by:    Date Additional Medicare IM Given:    Additional Medicare Important Message give by:     If discussed at Long Length of Stay Meetings, dates discussed:    Additional Comments:  Berna Bue, RN 09/23/2014, 2:06 PM

## 2014-09-23 NOTE — Care Management Note (Signed)
Case Management Note  Patient Details  Name: [name] MRN: [id number] Date of Birth: 1970-02-03  Subjective/Objective: Spoke to Mallie Snooks at the pt. Group [phone]. He states the pt. Does indeed have a guardian, an elderly aunt, named  [phone]. She lives in [place] , and visits regularly with the pt. According to the group home, the family come and take him out to visit regularly.     The aunt has been educated about our HIPAA policy and now knows the password to get info on the pt.               Action/Plan:   Expected Discharge Date:                  Expected Discharge Plan:     In-House Referral:     Discharge planning Services     Post Acute Care Choice:    Choice offered to:     DME Arranged:    DME Agency:     HH Arranged:    HH Agency:     Status of Service:     Medicare Important Message Given:  Yes-second notification given Date Medicare IM Given:    Medicare IM give by:    Date Additional Medicare IM Given:    Additional Medicare Important Message give by:     If discussed at Long Length of Stay Meetings, dates discussed:    Additional Comments:  Berna Bue, RN 09/23/2014, 11:07 AM

## 2014-09-23 NOTE — Progress Notes (Signed)
GI Inpatient Follow-up Note  Patient Identification: [name] is a 44 y.o. male s/p CCY with IOC showing CBD stone, ERCP unable to intubate CBD  Subjective:  Some abd pain that started this afternoon, but was doing well after ERCP and this am.  No f/c, no n/v. Tolerating PO, no rectal bleeding.   Scheduled Inpatient Medications:  . atorvastatin  40 mg Oral Daily  . benztropine  0.5 mg Oral BID  . budesonide  0.25 mg Nebulization BID  . famotidine  20 mg Oral Daily  . gabapentin  400 mg Oral TID  . haloperidol  5 mg Oral BID  . heparin  5,000 Units Subcutaneous 3 times per day  . LORazepam  1 mg Oral BID  . OLANZapine  15 mg Oral BID  . pantoprazole  40 mg Oral Daily    Continuous Inpatient Infusions:   . lactated ringers 100 mL/hr at 09/23/14 1340    PRN Inpatient Medications:  albuterol, HYDROcodone-acetaminophen, hydrOXYzine, hydrOXYzine, morphine injection, nicotine, ondansetron **OR** ondansetron (ZOFRAN) IV    Physical Examination: BP 131/73 mmHg  Pulse 81  Temp(Src) 99 F (37.2 C) (Oral)  Resp 18  Ht  (1.778 m)  Wt 64.91 kg (143 lb 1.6 oz)  BMI 20.53 kg/m2  SpO2 96% Gen: NAD, alert and oriented x 4 HEENT: PEERLA, EOMI, Neck: supple, no JVD or thyromegaly Chest: CTA bilaterally, no wheezes, crackles, or other adventitious sounds CV: RRR, no m/g/c/r Abd: mild TTP epigastric and RUQ, nabs, soft, no r/g Ext: no edema, well perfused with 2+ pulses, Skin: no rash or lesions noted Lymph: no LAD  Data: Lab Results  Component Value Date   WBC 16.0* 09/22/2014   HGB 13.6 09/22/2014   HCT 40.1 09/22/2014   MCV 90.8 09/22/2014   PLT 216 09/22/2014    Recent Labs Lab 09/18/14 2039 09/19/14 0427 09/22/14 0501  HGB 16.6 15.6 13.6   Lab Results  Component Value Date   NA 138 09/23/2014   K 3.5 09/23/2014   CL 106 09/23/2014   CO2 22 09/23/2014   BUN 6 09/23/2014   CREATININE 0.87 09/23/2014   Lab Results  Component Value Date   ALT 47  09/23/2014   AST 32 09/23/2014   ALKPHOS 251* 09/23/2014   BILITOT 1.3* 09/23/2014   No results for input(s): APTT, INR, PTT in the last 168 hours.   Assessment/Plan: Mr. Staniszewski is a 44 y.o. male with CBD stone on IOC, ERCP unable to intubate CBD, PD stent placed.  T bili up some but otherwise ast, alt, alkp down - repeat ERCP as inpt if liver enzymes don't cont to trend down,  Otherwise as outpt, likely at [facility]    Please call with questions or concerns.  Tatsuya Okray, Addison Naegeli, MD

## 2014-09-23 NOTE — Progress Notes (Signed)
[name] is a 44 y.o. male POD#3 from Lap chole IOC with retained CBD stone, POD#1 from ERCP, unable to flush retained stones.  Patient states some soreness along the incision sites and JP drain site but no resting pain today. Patient states that he is hungry.  He is passing flatus.   Filed Vitals:   09/23/14 0530  BP: 127/75  Pulse: 88  Temp: 98.7 F (37.1 C)  Resp: 16   General appearance: alert, cooperative and no distress Eyes: negative findings: no scleral icterus Abdomen: soft, appropriately tender along incision sites, c/d/i, no erytehma, JP drain in place with 130cc serosanginous drainage Extremities: 2+ pulses, no edema     Labs: CBC Latest Ref Rng 09/22/2014 09/19/2014 09/18/2014  WBC 3.8 - 10.6 K/uL 16.0(H) 10.0 13.4(H)  Hemoglobin 13.0 - 18.0 g/dL 16.1 09.6 04.5  Hematocrit 40.0 - 52.0 % 40.1 45.3 48.6  Platelets 150 - 440 K/uL 216 235 265   CMP Latest Ref Rng 09/23/2014 09/22/2014 09/20/2014  Glucose 65 - 99 mg/dL 68 91 85  BUN 6 - 20 mg/dL 6 5(L) 5(L)  Creatinine 0.61 - 1.24 mg/dL 4.09 8.11 9.14  Sodium 135 - 145 mmol/L 138 139 143  Potassium 3.5 - 5.1 mmol/L 3.5 3.3(L) 3.7  Chloride 101 - 111 mmol/L 106 107 110  CO2 22 - 32 mmol/L Calcium 8.9 - 10.3 mg/dL 8.2(L) 8.3(L) 8.5(L)  Total Protein 6.5 - 8.1 g/dL 6.4(L) 6.6 6.2(L)  Total Bilirubin 0.3 - 1.2 mg/dL 7.8(G) 1.1 0.8  Alkaline Phos 38 - 126 U/L 251(H) 274(H) 373(H)  AST 15 - 41 U/L 32 41 67(H)  ALT 17 - 63 U/L 47 66(H) 108(H)    Assessment/Plan: POD#3 from Lap chole IOC with retained CBD stone, POD#1 from ERCP with stent placement Will advance diet to clears today  Continue po and iv pain medication Restarted DVT prophylaxis Trend bilirubin to ensure non-obstructed from continued retained stone, if improves may have repeat ERCP as outpatient, if not may need further procedure this admission

## 2014-09-24 ENCOUNTER — Other Ambulatory Visit: Payer: Self-pay

## 2014-09-24 LAB — COMPREHENSIVE METABOLIC PANEL
ALBUMIN: 2.7 g/dL — AB (ref 3.5–5.0)
ALK PHOS: 215 U/L — AB (ref 38–126)
ALT: 33 U/L (ref 17–63)
AST: 21 U/L (ref 15–41)
Anion gap: 6 (ref 5–15)
BILIRUBIN TOTAL: 0.7 mg/dL (ref 0.3–1.2)
CALCIUM: 8.3 mg/dL — AB (ref 8.9–10.3)
CO2: 26 mmol/L (ref 22–32)
CREATININE: 0.77 mg/dL (ref 0.61–1.24)
Chloride: 108 mmol/L (ref 101–111)
GFR calc Af Amer: >60 mL/min (ref >60)
GFR calc non Af Amer: >60 mL/min (ref >60)
GLUCOSE: 97 mg/dL (ref 65–99)
Potassium: 3.4 mmol/L — ABNORMAL LOW (ref 3.5–5.1)
SODIUM: 140 mmol/L (ref 135–145)
TOTAL PROTEIN: 6.4 g/dL — AB (ref 6.5–8.1)

## 2014-09-24 LAB — CBC
HCT: 40.9 % (ref 40.0–52.0)
HEMOGLOBIN: 13.9 g/dL (ref 13.0–18.0)
MCH: 30.7 pg (ref 26.0–34.0)
MCHC: 33.9 g/dL (ref 32.0–36.0)
MCV: 90.7 fL (ref 80.0–100.0)
PLATELETS: 221 10*3/uL (ref 150–440)
RBC: 4.51 MIL/uL (ref 4.40–5.90)
RDW: 14.4 % (ref 11.5–14.5)
WBC: 15.1 10*3/uL — ABNORMAL HIGH (ref 3.8–10.6)

## 2014-09-24 NOTE — Progress Notes (Signed)
GI Inpatient Follow-up Note  Patient Identification: [name] is a 44 y.o. male with gallstones s/p CCY, + stone on IOC with failed ERCP  Subjective:  Feeels well today. Tolerating PO  NO n/v, no abd pain no f/c,  Wants to go home.   Scheduled Inpatient Medications:  . atorvastatin  40 mg Oral Daily  . benztropine  0.5 mg Oral BID  . famotidine  20 mg Oral Daily  . gabapentin  400 mg Oral TID  . haloperidol  5 mg Oral BID  . heparin  5,000 Units Subcutaneous 3 times per day  . LORazepam  1 mg Oral BID  . OLANZapine  15 mg Oral BID  . pantoprazole  40 mg Oral Daily    Continuous Inpatient Infusions:   . lactated ringers 100 mL/hr at 09/24/14 2157    PRN Inpatient Medications:  albuterol, HYDROcodone-acetaminophen, hydrOXYzine, hydrOXYzine, morphine injection, nicotine, ondansetron **OR** ondansetron (ZOFRAN) IV     Physical Examination: BP 139/94 mmHg  Pulse 74  Temp(Src) 98.9 F (37.2 C) (Oral)  Resp 16  Ht  (1.778 m)  Wt 64.91 kg (143 lb 1.6 oz)  BMI 20.53 kg/m2  SpO2 96% Gen: NAD, alert and oriented x 4 HEENT: PEERLA, EOMI, Neck: supple, no JVD or thyromegaly Chest: CTA bilaterally, no wheezes, crackles, or other adventitious sounds CV: RRR, no m/g/c/r Abd: soft, NT, ND, +BS in all four quadrants; no HSM, guarding, ridigity, or rebound tenderness, + surg sites c/d/i Ext: no edema, well perfused with 2+ pulses, Skin: no rash or lesions noted Lymph: no LAD  Data: Lab Results  Component Value Date   WBC 15.1* 09/24/2014   HGB 13.9 09/24/2014   HCT 40.9 09/24/2014   MCV 90.7 09/24/2014   PLT 221 09/24/2014    Recent Labs Lab 09/19/14 0427 09/22/14 0501 09/24/14 0559  HGB 15.6 13.6 13.9   Lab Results  Component Value Date   NA 140 09/24/2014   K 3.4* 09/24/2014   CL 108 09/24/2014   CO2 26 09/24/2014   BUN <5* 09/24/2014   CREATININE 0.77 09/24/2014   Lab Results  Component Value Date   ALT 33 09/24/2014   AST 21 09/24/2014   ALKPHOS 215* 09/24/2014   BILITOT 0.7 09/24/2014   No results for input(s): APTT, INR, PTT in the last 168 hours.   Assessment/Plan: Mr. [name] is a 44 y.o. male with CBD stone on IOC during CCY.  ERCP : unable to intubate CBD.  Recommendations: - possible ERCP tomorrow with Dr [name] - npo after mn - no am anti-coag - if ERCP not done tomorrow, ok to d/c and will arrange for ERCP on Fri or next week..   Please call with questions or concerns.  REIN, Addison Naegeli, MD

## 2014-09-24 NOTE — Progress Notes (Signed)
[name] is a 44 y.o. male POD#4 from Lap chole IOC with retained CBD stone, POD#2 from ERCP, unable to flush retained stones.  Patient states doing well today, pain much improved.  He tolerated the liquids without any nausea, vomiting or pain.  He is passing flatus.    Filed Vitals:   09/24/14 0536  BP: 132/74  Pulse: 68  Temp: 98.2 F (36.8 C)  Resp: 20   I/O last 3 completed shifts: In: 3729 [P.O.:1040; I.V.:2689] Out: 3360 [Urine:3275; Drains:85] Total I/O In: 304 [P.O.:120; I.V.:184] Out: 895 [Urine:895]  General appearance: alert, cooperative and no distress Abdomen: soft, appropriately tender, non-distended, incision sites c/d/i, jp output serous 85cc Extremities: 2+ pulses, no edema   Labs:  CBC Latest Ref Rng 09/24/2014 09/22/2014 09/19/2014  WBC 3.8 - 10.6 K/uL 15.1(H) 16.0(H) 10.0  Hemoglobin 13.0 - 18.0 g/dL 86.5 78.4 69.6  Hematocrit 40.0 - 52.0 % 40.9 40.1 45.3  Platelets 150 - 440 K/uL 221 216 235   CMP Latest Ref Rng 09/24/2014 09/23/2014 09/22/2014  Glucose 65 - 99 mg/dL 97 68 91  BUN 6 - 20 mg/dL <2(X) 6 5(L)  Creatinine 0.61 - 1.24 mg/dL 5.28 4.13 2.44  Sodium 135 - 145 mmol/L 140 138 139  Potassium 3.5 - 5.1 mmol/L 3.4(L) 3.5 3.3(L)  Chloride 101 - 111 mmol/L 108 106 107  CO2 22 - 32 mmol/L Calcium 8.9 - 10.3 mg/dL 8.3(L) 8.2(L) 8.3(L)  Total Protein 6.5 - 8.1 g/dL 6.4(L) 6.4(L) 6.6  Total Bilirubin 0.3 - 1.2 mg/dL 0.7 0.1(U) 1.1  Alkaline Phos 38 - 126 U/L 215(H) 251(H) 274(H)  AST 15 - 41 U/L 21 32 41  ALT 17 - 63 U/L 33 47 66(H)   Assessment/Plan: 44 y.o. male POD#4 from Lap chole IOC with retained CBD stone, POD#2 from ERCP stent placement Advance to Regular diet today, continue JP drain LFTs trending down, will check CMP in AM Appreciate GI help, likely will get ERCP as outpatient if tolerates diet

## 2014-09-24 NOTE — Progress Notes (Signed)
Initial Nutrition Assessment   INTERVENTION:   Meals and Snacks: Cater to patient preferences as diet order now advanced Medical Food Supplement Therapy: will send [employer] Instant Breakfast TID for added nutrition   NUTRITION DIAGNOSIS:   Inadequate oral intake related to altered GI function as evidenced by  (NPO/CL diet order since admission, however just advanced).  GOAL:   Patient will meet greater than or equal to 90% of their needs  MONITOR:    (Energy Intake, Electrolyte and Renal Profile, Digestive system, Anthropometrics)  REASON FOR ASSESSMENT:   NPO/Clear Liquid Diet    ASSESSMENT:   Pt admitted with abdominal pain secondary to cholecystitis, s/p CCY and ERCP however unable to intubate CBD per MD note with PD stent placed.   Past Medical History  Diagnosis Date  . Schizophrenia   . Hypercholesteremia     Diet Order:  Diet regular Room service appropriate?: Yes; Fluid consistency:: Thin    Current Nutrition: Pt had eaten only apple juice this am. Pt reports tolerating CL since admission.   Food/Nutrition-Related History: Pt reports decrease in appetite progressively over the week PTA, pt reports trying to eat the days before admission but then would vomit.   Medications: L-R at 146mL/hr, Ativan, haldol, protonix  Electrolyte/Renal Profile and Glucose Profile:   Recent Labs Lab 09/22/14 0501 09/23/14 0500 09/24/14 0559  NA 139 138 140  K 3.3* 3.5 3.4*  CL 107 106 108  CO2 BUN 5* 6 <5*  CREATININE 0.89 0.87 0.77  CALCIUM 8.3* 8.2* 8.3*  GLUCOSE 91 68 97   Protein Profile:  Recent Labs Lab 09/22/14 0501 09/23/14 0500 09/24/14 0559  ALBUMIN 3.0* 2.9* 2.7*    Gastrointestinal Profile: Last BM:  09/23/2014   Nutrition-Focused Physical Exam Findings: Nutrition-Focused physical exam completed. Findings are WDL for fat depletion, muscle depletion, and edema.    Weight Change: Pt reports thinking weight was 150lbs at [facility]  clinic  One week PTA 5% weight loss in 2 weeks.  Height:   Ht Readings from Last 1 Encounters:  09/19/14  (1.778 m)    Weight:   Wt Readings from Last 1 Encounters:  09/19/14 143 lb 1.6 oz (64.91 kg)     BMI:  Body mass index is 20.53 kg/(m^2).  Estimated Nutritional Needs:   Kcal:  BEE: 1545kcals, TEE: (IF 1.1-1.3)(AF 1.2) 2039-2410kcals  Protein:  52-65g protein (0.8-1.0g/kg)  Fluid:  1623-1959mL of fluid (25-10mL/kg)  EDUCATION NEEDS:   No education needs identified at this time    MODERATE Care Level  Leda Quail, RD, LDN Pager [phone]

## 2014-09-24 NOTE — Care Management Important Message (Signed)
Important Message  Patient Details  Name: [name] MRN: [id number] Date of Birth: [birth year]   Medicare Important Message Given:  Yes-third notification given    Olegario Messier A Allmond 09/24/2014, 10:23 AM

## 2014-09-25 ENCOUNTER — Inpatient Hospital Stay: Payer: Medicare Other | Admitting: Anesthesiology

## 2014-09-25 ENCOUNTER — Ambulatory Visit: Admit: 2014-09-25 | Payer: Self-pay | Admitting: Gastroenterology

## 2014-09-25 ENCOUNTER — Encounter
Admission: EM | Disposition: A | Discharge status: Home / Self Care | Payer: Self-pay | Source: Home / Self Care | Attending: Surgery

## 2014-09-25 DIAGNOSIS — Z4659 Encounter for fitting and adjustment of other gastrointestinal appliance and device: Secondary | ICD-10-CM | POA: Insufficient documentation

## 2014-09-25 DIAGNOSIS — K8050 Calculus of bile duct without cholangitis or cholecystitis without obstruction: Secondary | ICD-10-CM | POA: Insufficient documentation

## 2014-09-25 HISTORY — PX: ERCP: SHX5425

## 2014-09-25 LAB — COMPREHENSIVE METABOLIC PANEL
ALBUMIN: 2.6 g/dL — AB (ref 3.5–5.0)
ALK PHOS: 207 U/L — AB (ref 38–126)
ALT: 27 U/L (ref 17–63)
ANION GAP: 7 (ref 5–15)
AST: 20 U/L (ref 15–41)
BILIRUBIN TOTAL: 0.9 mg/dL (ref 0.3–1.2)
CALCIUM: 8.4 mg/dL — AB (ref 8.9–10.3)
CO2: 25 mmol/L (ref 22–32)
Chloride: 110 mmol/L (ref 101–111)
Creatinine, Ser: 0.82 mg/dL (ref 0.61–1.24)
GFR calc Af Amer: >60 mL/min (ref >60)
GFR calc non Af Amer: >60 mL/min (ref >60)
GLUCOSE: 87 mg/dL (ref 65–99)
POTASSIUM: 3.3 mmol/L — AB (ref 3.5–5.1)
SODIUM: 142 mmol/L (ref 135–145)
TOTAL PROTEIN: 6.4 g/dL — AB (ref 6.5–8.1)

## 2014-09-25 SURGERY — ERCP, WITH INTERVENTION IF INDICATED
Anesthesia: General

## 2014-09-25 MED ORDER — INDOMETHACIN 50 MG RE SUPP
100.0000 mg | RECTAL | Status: AC
  Administered 2014-09-25: 100 mg via RECTAL
  Filled 2014-09-25: qty 2

## 2014-09-25 MED ORDER — PHENYLEPHRINE HCL 10 MG/ML IJ SOLN
INTRAMUSCULAR | Status: DC | PRN
  Administered 2014-09-25 (×2): 100 ug via INTRAVENOUS

## 2014-09-25 MED ORDER — MIDAZOLAM HCL 5 MG/5ML IJ SOLN
INTRAMUSCULAR | Status: DC | PRN
  Administered 2014-09-25: 2 mg via INTRAVENOUS

## 2014-09-25 MED ORDER — FENTANYL CITRATE (PF) 100 MCG/2ML IJ SOLN: 25.0000 ug | INTRAMUSCULAR | Status: DC | PRN

## 2014-09-25 MED ORDER — SODIUM CHLORIDE 0.9 % IV SOLN
INTRAVENOUS | Status: DC
Start: 2014-09-25 — End: 2014-09-25
  Administered 2014-09-25: 1000 mL via INTRAVENOUS

## 2014-09-25 MED ORDER — SUCCINYLCHOLINE CHLORIDE 20 MG/ML IJ SOLN
INTRAMUSCULAR | Status: DC | PRN
  Administered 2014-09-25: 100 mg via INTRAVENOUS

## 2014-09-25 MED ORDER — FENTANYL CITRATE (PF) 100 MCG/2ML IJ SOLN
INTRAMUSCULAR | Status: DC | PRN
  Administered 2014-09-25: 100 ug via INTRAVENOUS

## 2014-09-25 MED ORDER — ONDANSETRON HCL 4 MG/2ML IJ SOLN: 4.0000 mg | Freq: Once | INTRAMUSCULAR | Status: DC | PRN

## 2014-09-25 MED ORDER — PROPOFOL 10 MG/ML IV BOLUS
INTRAVENOUS | Status: DC | PRN
  Administered 2014-09-25: 150 mg via INTRAVENOUS

## 2014-09-25 NOTE — Anesthesia Procedure Notes (Signed)
Procedure Name: Intubation Date/Time: 09/25/2014 11:06 AM Performed by: Lily Kocher Pre-anesthesia Checklist: Patient identified, Patient being monitored, Timeout performed, Emergency Drugs available and Suction available Patient Re-evaluated:Patient Re-evaluated prior to inductionOxygen Delivery Method: Circle system utilized Preoxygenation: Pre-oxygenation with 100% oxygen Intubation Type: IV induction Ventilation: Mask ventilation without difficulty Laryngoscope Size: Mac and 3 Grade View: Grade II Tube type: Oral Tube size: 7.5 mm Number of attempts: 2 (Pt intubated per Dr. Charlann Boxer) Airway Equipment and Method: Stylet Placement Confirmation: ETT inserted through vocal cords under direct vision,  positive ETCO2 and breath sounds checked- equal and bilateral Secured at: 22 cm Tube secured with: Tape Dental Injury: Teeth and Oropharynx as per pre-operative assessment

## 2014-09-25 NOTE — Anesthesia Postprocedure Evaluation (Signed)
  Anesthesia Post-op Note  Patient: [name]  Procedure(s) Performed: Procedure(s): ENDOSCOPIC RETROGRADE CHOLANGIOPANCREATOGRAPHY (ERCP) (N/A)  Anesthesia type:General  Patient location: PACU  Post pain: Pain level controlled  Post assessment: Post-op Vital signs reviewed, Patient's Cardiovascular Status Stable, Respiratory Function Stable, Patent Airway and No signs of Nausea or vomiting  Post vital signs: Reviewed and stable  Last Vitals:  Filed Vitals:   09/25/14 1206  BP: 121/80  Pulse: 74  Temp: 36.4 C  Resp: 21    Level of consciousness: awake, alert  and patient cooperative  Complications: No apparent anesthesia complications

## 2014-09-25 NOTE — Progress Notes (Signed)
[name] is a 44 y.o. male POD#5 from Lap chole IOC with retained CBD stone.  He went for ERCP today with removal of pancreatic stent and sphincterotomy with removal of 2 CBD stones.  Patient doing well afterward and tolerating a full liquid diet.  Denies any pain  Filed Vitals:   09/25/14 1300  BP: 154/80  Pulse: 58  Temp: 97.9 F (36.6 C)  Resp: 15   I/O last 3 completed shifts: In: 4566 [P.O.:390; I.V.:4176] Out: 3415 [Urine:3345; Drains:70] Total I/O In: 1795 [P.O.:600; I.V.:1195] Out: 40 [Drains:40]   General appearance: alert and cooperative Abdomen: soft, appropriately tender, JP with serous drainage, incisions c/d/i with steris in place Extremities: 2+pulses, no edema   Labs:   CBC Latest Ref Rng 09/24/2014 09/22/2014 09/19/2014  WBC 3.8 - 10.6 K/uL 15.1(H) 16.0(H) 10.0  Hemoglobin 13.0 - 18.0 g/dL 16.1 09.6 04.5  Hematocrit 40.0 - 52.0 % 40.9 40.1 45.3  Platelets 150 - 440 K/uL 221 216 235    CMP Latest Ref Rng 09/25/2014 09/24/2014 09/23/2014  Glucose 65 - 99 mg/dL 87 97 68  BUN 6 - 20 mg/dL <4(U) <9(W) 6  Creatinine 0.61 - 1.24 mg/dL 1.19 1.47 8.29  Sodium 135 - 145 mmol/L 142 140 138  Potassium 3.5 - 5.1 mmol/L 3.3(L) 3.4(L) 3.5  Chloride 101 - 111 mmol/L 110 108 106  CO2 22 - 32 mmol/L Calcium 8.9 - 10.3 mg/dL 5.6(O) 8.3(L) 8.2(L)  Total Protein 6.5 - 8.1 g/dL 6.4(L) 6.4(L) 6.4(L)  Total Bilirubin 0.3 - 1.2 mg/dL 0.9 0.7 1.3(Y)  Alkaline Phos 38 - 126 U/L 207(H) 215(H) 251(H)  AST 15 - 41 U/L 20 21 32  ALT 17 - 63 U/L 27 33 47    Assessment/Plan:  44 yr old male POD#5 from Lap chole IOC with retained CBD stones, had ERCP today Advance to full liquid diet Check LFTs in AM Likely D/c home torrow

## 2014-09-25 NOTE — Op Note (Signed)
[facility] Patient Name: [name] Procedure Date: 09/25/2014 9:46 AM MRN: [id number] Account #: [id number] Date of Birth: May 07, 1970 Admit Type: Inpatient Age: 44 Room: Ambulatory [facility] ENDO ROOM 4 Gender: Male Note Status: Finalized Procedure:         ERCP Indications:       Bile duct stone(s) Providers:         Midge Minium, MD Medicines:         General Anesthesia Complications:     No immediate complications. Procedure:         Pre-Anesthesia Assessment:                    - Prior to the procedure, a History and Physical was                     performed, and patient medications and allergies were                     reviewed. The patient's tolerance of previous anesthesia                     was also reviewed. The risks and benefits of the procedure                     and the sedation options and risks were discussed with the                     patient. All questions were answered, and informed consent                     was obtained. Prior Anticoagulants: The patient has taken                     no previous anticoagulant or antiplatelet agents. ASA                     Grade Assessment: II - A patient with mild systemic                     disease. After reviewing the risks and benefits, the                     patient was deemed in satisfactory condition to undergo                     the procedure.                    After obtaining informed consent, the scope was passed                     under direct vision. Throughout the procedure, the                     patient's blood pressure, pulse, and oxygen saturations                     were monitored continuously. The ERCP was introduced                     through the mouth, and used to inject contrast into and                     used to inject contrast into the bile duct. The  ERCP was                     accomplished without difficulty. The patient tolerated the                     procedure  well. Findings:      A pancreatic stent was visible on the scout film. One plastic stent       originating in the pancreatic duct was emerging from the major papilla.       One stent was removed from the pancreatic duct using a snare. A wire was       passed into the biliary tree. The biliary sphincterotomy was extended       with a sphincterotome using ERBE electrocautery. There was no       post-sphincterotomy bleeding. The bile duct was deeply cannulated.       Contrast was injected. I personally interpreted the bile duct images.       There was brisk flow of contrast through the ducts. Image quality was       excellent. Contrast extended to the entire biliary tree. The lower third       of the main bile duct contained two stones mm. A wire was passed into       the biliary tree. The biliary tree was swept with a 15 mm balloon       starting at the bifurcation. Two stones were removed. No stones remained. Impression:        - One stent from the pancreatic duct was seen in the major                     papilla.                    - One stent was removed from the pancreatic duct.                    - A sphincterotomy was performed.                    - The biliary tree was swept.                    - Choledocholithiasis was found. Complete removal was                     accomplished by biliary sphincterotomy and balloon                     extraction.                    - No specimens collected. Recommendation:    - Watch for pancreatitis, bleeding, perforation, and                     cholangitis. Procedure Code(s): --- Professional ---                    [phone], Endoscopic retrograde cholangiopancreatography                     (ERCP); with removal of foreign body(s) or stent(s) from                     biliary/pancreatic duct(s)  [phone], Endoscopic retrograde cholangiopancreatography                     (ERCP); with removal of calculi/debris from                      biliary/pancreatic duct(s)                    43262, Endoscopic retrograde cholangiopancreatography                     (ERCP); with sphincterotomy/papillotomy                    [phone], Endoscopic catheterization of the biliary ductal                     system, radiological supervision and interpretation Diagnosis Code(s): --- Professional ---                    K80.50, Calculus of bile duct without cholangitis or                     cholecystitis without obstruction                    Z96.89, Presence of other specified functional implants                    Z46.59, Encounter for fitting and adjustment of other                     gastrointestinal appliance and device CPT copyright 2014 American [employer] Association. All rights reserved. The codes documented in this report are preliminary and upon coder review may  be revised to meet current compliance requirements. Midge Minium, MD 09/25/2014 11:51:27 AM This report has been signed electronically. Number of Addenda: 0 Note Initiated On: 09/25/2014 9:46 AM      [facility]

## 2014-09-25 NOTE — Transfer of Care (Signed)
Immediate Anesthesia Transfer of Care Note  Patient: [name]  Procedure(s) Performed: Procedure(s): ENDOSCOPIC RETROGRADE CHOLANGIOPANCREATOGRAPHY (ERCP) (N/A)  Patient Location: PACU  Anesthesia Type:General  Level of Consciousness: awake and patient cooperative  Airway & Oxygen Therapy: Patient Spontanous Breathing and Patient connected to face mask oxygen  Post-op Assessment: Report given to RN and Post -op Vital signs reviewed and stable  Post vital signs: Reviewed and stable  Last Vitals:  Filed Vitals:   09/25/14 1206  BP: 121/80  Pulse: 74  Temp: 36.4 C  Resp: 21    Complications: No apparent anesthesia complications

## 2014-09-25 NOTE — Anesthesia Preprocedure Evaluation (Addendum)
Anesthesia Evaluation  Patient identified by MRN, date of birth, ID band Patient awake    Reviewed: Allergy & Precautions, NPO status   Airway Mallampati: II       Dental no notable dental hx.    Pulmonary Current Smoker,     + decreased breath sounds      Cardiovascular negative cardio ROS Normal cardiovascular exam     Neuro/Psych Schizophrenia    GI/Hepatic negative GI ROS, Neg liver ROS,   Endo/Other  negative endocrine ROS  Renal/GU negative Renal ROS     Musculoskeletal   Abdominal Normal abdominal exam  (+)   Peds  Hematology negative hematology ROS (+)   Anesthesia Other Findings   Reproductive/Obstetrics                            Anesthesia Physical Anesthesia Plan  ASA: III  Anesthesia Plan: General   Post-op Pain Management:    Induction: Intravenous  Airway Management Planned: Oral ETT  Additional Equipment:   Intra-op Plan:   Post-operative Plan: Extubation in OR  Informed Consent: I have reviewed the patients History and Physical, chart, labs and discussed the procedure including the risks, benefits and alternatives for the proposed anesthesia with the patient or authorized representative who has indicated his/her understanding and acceptance.     Plan Discussed with:   Anesthesia Plan Comments:         Anesthesia Quick Evaluation

## 2014-09-26 LAB — COMPREHENSIVE METABOLIC PANEL
ALBUMIN: 2.8 g/dL — AB (ref 3.5–5.0)
ALT: 25 U/L (ref 17–63)
AST: 26 U/L (ref 15–41)
Alkaline Phosphatase: 193 U/L — ABNORMAL HIGH (ref 38–126)
Anion gap: 5 (ref 5–15)
BILIRUBIN TOTAL: 0.7 mg/dL (ref 0.3–1.2)
CHLORIDE: 111 mmol/L (ref 101–111)
CO2: 27 mmol/L (ref 22–32)
Calcium: 8.6 mg/dL — ABNORMAL LOW (ref 8.9–10.3)
Creatinine, Ser: 0.83 mg/dL (ref 0.61–1.24)
GFR calc Af Amer: >60 mL/min (ref >60)
GFR calc non Af Amer: >60 mL/min (ref >60)
GLUCOSE: 112 mg/dL — AB (ref 65–99)
POTASSIUM: 3.8 mmol/L (ref 3.5–5.1)
Sodium: 143 mmol/L (ref 135–145)
TOTAL PROTEIN: 6.5 g/dL (ref 6.5–8.1)

## 2014-09-26 LAB — LIPASE, BLOOD: Lipase: 24 U/L (ref 22–51)

## 2014-09-26 MED ORDER — HYDROCODONE-ACETAMINOPHEN 5-325 MG PO TABS: 1.0000 | ORAL_TABLET | ORAL | Status: DC | PRN

## 2014-09-26 NOTE — Clinical Social Work Note (Signed)
Patient to discharge today to return to [facility]. Packet and FL2 ready to go with patient. Patient's nurse, Beather Arbour., has called the family care home and spoken to Brett Canales who is going to come and pick patient up.  York Spaniel MSW,LCSW [phone]

## 2014-09-26 NOTE — Discharge Summary (Signed)
Physician Discharge Summary  Patient ID: [name] MRN: [id number] DOB/AGE: 43-Mar-1972 43 y.o.  Admit date: 09/19/2014 Discharge date: 09/26/2014  Admission Diagnoses: Cholecystitis without obstruction but with choledocholithiasis  Discharge Diagnoses:  Active Problems:   Choledocholithiasis   Abdominal pain, acute   Hyperbilirubinemia   Biliary colic   Encounter for fitting of gastrointestinal device   Discharged Condition: good  Hospital Course:  44 yr old with acute cholecystitis.  Patient was taken to the OR for Lap chole with IOC, showing CBD stones and JP drain placement by Dr. Natale Lay.  Patient was tehn taken for ERCP the next day but unable to canulate the CBD on that attempt.  Patient's LFTs were trended and initially improved but began to elevate again.  He was then taken for second ERCP yesterday where two CBD stones were removed and sphincterotomy performed.  He tolerated this well, taking in a regular diet, T bili 0.7 today.  JP drain with 45 cc output serous material.  He denies any pain, states he feels much better.    Consults: None  Significant Diagnostic Studies: ERCP--CBD stones, two removed  Treatments: IV hydration, antibiotics: Ancef and surgery: Lap chole with IOC, ERCP--stone removal and sphincterotomy  Discharge Exam: Blood pressure 134/87, pulse 103, temperature 98 F (36.7 C), temperature source Oral, resp. rate 18, height 5\' 10"  (1.778 m), weight 150 lb (68.04 kg), SpO2 96 %. General appearance: alert, cooperative and no distress GI: soft, incision sites c/d/i, jp with minimal serous output, no tenderness Extremities: 2+ pulses, no edema  Disposition: Final discharge disposition not confirmed  Discharge Instructions    Call MD for:  persistant nausea and vomiting    Complete by:  As directed      Call MD for:  redness, tenderness, or signs of infection (pain, swelling, redness, odor or green/yellow discharge around incision site)    Complete by:  As  directed      Call MD for:  severe uncontrolled pain    Complete by:  As directed      Call MD for:  temperature >100.4    Complete by:  As directed      Diet - low sodium heart healthy    Complete by:  As directed      Discontinue JP/Blake drain    Complete by:  As directed      Driving Restrictions    Complete by:  As directed   No driving while on prescription pain medications     Increase activity slowly    Complete by:  As directed      Lifting restrictions    Complete by:  As directed   No lifting over 15 lbs for two weeks     No dressing needed    Complete by:  As directed             Medication List    TAKE these medications        albuterol 108 (90 BASE) MCG/ACT inhaler  Commonly known as:  PROVENTIL HFA;VENTOLIN HFA  Inhale 2 puffs into the lungs every 4 (four) hours as needed for wheezing or shortness of breath.     atorvastatin 40 MG tablet  Commonly known as:  LIPITOR  Take 40 mg by mouth daily.     benztropine 0.5 MG tablet  Commonly known as:  COGENTIN  Take 0.5 mg by mouth 2 (two) times daily.     esomeprazole 40 MG capsule  Commonly known as:  NEXIUM  Take 40 mg by mouth daily.     fluticasone 110 MCG/ACT inhaler  Commonly known as:  FLOVENT HFA  Inhale 2 puffs into the lungs 2 (two) times daily.     gabapentin 400 MG capsule  Commonly known as:  NEURONTIN  Take 400 mg by mouth 3 (three) times daily.     haloperidol 5 MG tablet  Commonly known as:  HALDOL  Take 5 mg by mouth 2 (two) times daily.     HYDROcodone-acetaminophen 5-325 MG per tablet  Commonly known as:  NORCO/VICODIN  Take 1-2 tablets by mouth every 4 (four) hours as needed for moderate pain.     hydrOXYzine 50 MG capsule  Commonly known as:  VISTARIL  Take 50 mg by mouth 4 (four) times daily as needed for anxiety (agitation. Limit 4 caps per 24 hours.).     LORazepam 1 MG tablet  Commonly known as:  ATIVAN  Take 1 mg by mouth 2 (two) times daily.     OLANZapine 15 MG  tablet  Commonly known as:  ZYPREXA  Take 15 mg by mouth 2 (two) times daily.     ranitidine 150 MG capsule  Commonly known as:  ZANTAC  Take 150 mg by mouth 2 (two) times daily.           Follow-up Information    Follow up with Natale Lay, MD In 1 week.   Specialties:  Surgery, Radiology   Contact information:   [address] [place] [address] [phone]       Signed: Gladis Riffle 09/26/2014, 2:42 PM

## 2014-09-26 NOTE — Care Management Important Message (Signed)
Important Message  Patient Details  Name: [name] MRN: [id number] Date of Birth: 23-Dec-1970   Medicare Important Message Given:  Yes-fourth notification given    Olegario Messier A Allmond 09/26/2014, 11:30 AM

## 2014-09-26 NOTE — Progress Notes (Signed)
GI Note:  S/p succesfule ERCP with stone extraction.  Doing well.  A/pl: -safe for dc in am if tolerates breakfast and liver enzymes and lipase look good.

## 2014-09-27 ENCOUNTER — Encounter: Payer: Self-pay | Admitting: Gastroenterology

## 2014-10-06 ENCOUNTER — Ambulatory Visit: Payer: Medicare Other | Admitting: Surgery

## 2014-10-07 ENCOUNTER — Ambulatory Visit: Payer: Medicare Other | Admitting: Surgery

## 2014-10-08 ENCOUNTER — Encounter: Payer: Self-pay | Admitting: Surgery

## 2014-10-08 ENCOUNTER — Ambulatory Visit (INDEPENDENT_AMBULATORY_CARE_PROVIDER_SITE_OTHER): Payer: Medicare Other | Admitting: Surgery

## 2014-10-08 VITALS — BP 131/70 | HR 105 | Temp 98.1°F | Ht 70.0 in | Wt 148.0 lb

## 2014-10-08 DIAGNOSIS — K8050 Calculus of bile duct without cholangitis or cholecystitis without obstruction: Secondary | ICD-10-CM

## 2014-10-08 NOTE — Progress Notes (Signed)
Surgery clinic  The patient is proximal to 3 weeks status post laparoscopic cholecystectomy with cholangiography. He had a retained common bile duct stone. He presented actually with choledocholithiasis. An ERCP was then performed which was successful in removing 2 stones. Jackson-Pratt drain remained nonbilious and was subsequently discontinued prior to his discharge from the hospital. The patient lives in a group home due to his chronic schizophrenia. He is doing well. Affect and mood is normal today in the office. Abdomen is soft and nontender there is no evidence of scleral icterus.  Pathology is consistent with low lithiasis and cholecystitis. ERCP report was personally reviewed and a copy was provided for the patient's chart along with the pathology report.  Impression doing well.  Plan activity and diet as tolerated L see him back in my office as needed. Needed paperwork for the group home was provided in handwritten form.

## 2014-10-08 NOTE — Patient Instructions (Signed)
Follow up in office as needed 

## 2015-08-10 ENCOUNTER — Emergency Department
Admission: EM | Admit: 2015-08-10 | Discharge: 2015-08-10 | Disposition: A | Discharge status: Home / Self Care | Payer: Medicare Other | Attending: Emergency Medicine | Admitting: Emergency Medicine

## 2015-08-10 DIAGNOSIS — K0889 Other specified disorders of teeth and supporting structures: Secondary | ICD-10-CM | POA: Diagnosis present

## 2015-08-10 DIAGNOSIS — F17210 Nicotine dependence, cigarettes, uncomplicated: Secondary | ICD-10-CM | POA: Diagnosis not present

## 2015-08-10 DIAGNOSIS — K047 Periapical abscess without sinus: Secondary | ICD-10-CM | POA: Insufficient documentation

## 2015-08-10 MED ORDER — OXYCODONE-ACETAMINOPHEN 5-325 MG PO TABS: 1.0000 | ORAL_TABLET | Freq: Four times a day (QID) | ORAL | 0 refills | Status: DC | PRN

## 2015-08-10 MED ORDER — IBUPROFEN 600 MG PO TABS: 600.0000 mg | ORAL_TABLET | Freq: Three times a day (TID) | ORAL | 0 refills | Status: DC | PRN

## 2015-08-10 MED ORDER — AMOXICILLIN 500 MG PO CAPS: 500.0000 mg | ORAL_CAPSULE | Freq: Three times a day (TID) | ORAL | 0 refills | Status: DC

## 2015-08-10 NOTE — ED Provider Notes (Signed)
[facility] Emergency Department Provider Note   ____________________________________________   First MD Initiated Contact with Patient 08/10/15 2003     (approximate)  I have reviewed the triage vital signs and the nursing notes.   HISTORY  Chief Complaint Dental Pain    HPI [name] is a 45 y.o. male patient complain of toothache for 2 days. Patient has a swelling to the left lower mandible for the same time frame. Patient has a scheduled appointment with dentist on 08/13/2015. Patient rates his pain as a 7/10. Patient described a pain as sharp. No palliative measures taken for this complaint except for Tylenol.   Past Medical History:  Diagnosis Date  . Hypercholesteremia   . Schizophrenia [facility])     Patient Active Problem List   Diagnosis Date Noted  . Biliary colic   . Encounter for fitting of gastrointestinal device   . Abdominal pain, acute   . Hyperbilirubinemia   . Choledocholithiasis 09/19/2014  . Transaminitis     Past Surgical History:  Procedure Laterality Date  . CHOLECYSTECTOMY N/A 09/20/2014   Procedure: LAPAROSCOPIC CHOLECYSTECTOMY;  Surgeon: Natale Lay, MD;  Location: [facility] ORS;  Service: General;  Laterality: N/A;  . ENDOSCOPIC RETROGRADE CHOLANGIOPANCREATOGRAPHY (ERCP) WITH PROPOFOL N/A 09/22/2014   Procedure: ENDOSCOPIC RETROGRADE CHOLANGIOPANCREATOGRAPHY (ERCP) WITH PROPOFOL;  Surgeon: Wallace Cullens, MD;  Location: [facility] ENDOSCOPY;  Service: Endoscopy;  Laterality: N/A;  . ERCP N/A 09/25/2014   Procedure: ENDOSCOPIC RETROGRADE CHOLANGIOPANCREATOGRAPHY (ERCP);  Surgeon: Midge Minium, MD;  Location: [facility] ENDOSCOPY;  Service: Endoscopy;  Laterality: N/A;    Prior to Admission medications   Medication Sig Start Date End Date Taking? Authorizing Provider  albuterol (PROVENTIL HFA;VENTOLIN HFA) 108 (90 BASE) MCG/ACT inhaler Inhale 2 puffs into the lungs every 4 (four) hours as needed for wheezing or shortness of breath.    Historical  Provider, MD  amoxicillin (AMOXIL) 500 MG capsule Take 1 capsule (500 mg total) by mouth 3 (three) times daily. 08/10/15   Joni Reining, PA-C  atorvastatin (LIPITOR) 40 MG tablet Take 40 mg by mouth daily.    Historical Provider, MD  benztropine (COGENTIN) 0.5 MG tablet Take 0.5 mg by mouth 2 (two) times daily.    Historical Provider, MD  esomeprazole (NEXIUM) 40 MG capsule Take 40 mg by mouth daily.     Historical Provider, MD  fluticasone (FLOVENT HFA) 110 MCG/ACT inhaler Inhale 2 puffs into the lungs 2 (two) times daily.    Historical Provider, MD  gabapentin (NEURONTIN) 400 MG capsule Take 400 mg by mouth 3 (three) times daily.    Historical Provider, MD  haloperidol (HALDOL) 5 MG tablet Take 5 mg by mouth 2 (two) times daily.    Historical Provider, MD  HYDROcodone-acetaminophen (NORCO/VICODIN) 5-325 MG per tablet Take 1-2 tablets by mouth every 4 (four) hours as needed for moderate pain. 09/26/14   Gladis Riffle, MD  hydrOXYzine (VISTARIL) 50 MG capsule Take 50 mg by mouth 4 (four) times daily as needed for anxiety (agitation. Limit 4 caps per 24 hours.).    Historical Provider, MD  ibuprofen (ADVIL,MOTRIN) 600 MG tablet Take 1 tablet (600 mg total) by mouth every 8 (eight) hours as needed. 08/10/15   Joni Reining, PA-C  LORazepam (ATIVAN) 1 MG tablet Take 1 mg by mouth 2 (two) times daily.     Historical Provider, MD  OLANZapine (ZYPREXA) 15 MG tablet Take 15 mg by mouth 2 (two) times daily.    Historical Provider, MD  oxyCODONE-acetaminophen (ROXICET) 5-325 MG tablet Take 1 tablet by mouth every 6 (six) hours as needed for moderate pain. 08/10/15   Joni Reining, PA-C  ranitidine (ZANTAC) 150 MG capsule Take 150 mg by mouth 2 (two) times daily.    Historical Provider, MD    Allergies Review of patient's allergies indicates no known allergies.  Family History  Problem Relation Age of Onset  . Heart murmur Mother     Social History Social History  Substance Use Topics  .  Smoking status: Current Every Day Smoker    Packs/day: 1.00    Types: Cigarettes  . Smokeless tobacco: Never Used  . Alcohol use No    Review of Systems Constitutional: No fever/chills Eyes: No visual changes. ENT: No sore throat. Dental pain Cardiovascular: Denies chest pain. Respiratory: Denies shortness of breath. Gastrointestinal: No abdominal pain.  No nausea, no vomiting.  No diarrhea.  No constipation. Genitourinary: Negative for dysuria. Musculoskeletal: Negative for back pain. Skin: Negative for rash. Neurological: Negative for headaches, focal weakness or numbness.    ____________________________________________   PHYSICAL EXAM:  VITAL SIGNS: ED Triage Vitals  Enc Vitals Group     BP 08/10/15 1818 126/86     Pulse Rate 08/10/15 1818 (!) 125     Resp 08/10/15 1818 16     Temp 08/10/15 1818 98.4 F (36.9 C)     Temp Source 08/10/15 1818 Oral     SpO2 08/10/15 1818 97 %     Weight 08/10/15 1818 150 lb (68 kg)     Height 08/10/15 1818 5\' 8"  (1.727 m)     Head Circumference --      Peak Flow --      Pain Score 08/10/15 1823 6     Pain Loc --      Pain Edu? --      Excl. in GC? --     Constitutional: Alert and oriented. Well appearing and in no acute distress. Eyes: Conjunctivae are normal. PERRL. EOMI. Head: Atraumatic. Nose: No congestion/rhinnorhea. Mouth/Throat: Mucous membranes are moist. Edematous gingiva and fractured tooth #19 Oropharynx non-erythematous. Neck: No stridor.  No cervical spine tenderness to palpation Hematological/Lymphatic/Immunilogical: No cervical lymphadenopathy. Cardiovascular: Normal rate, regular rhythm. Grossly normal heart sounds.  Good peripheral circulation. Respiratory: Normal respiratory effort.  No retractions. Lungs CTAB. Gastrointestinal: Soft and nontender. No distention. No abdominal bruits. No CVA tenderness. Musculoskeletal: No lower extremity tenderness nor edema.  No joint effusions. Neurologic:  Normal speech  and language. No gross focal neurologic deficits are appreciated. No gait instability. Skin:  Skin is warm, dry and intact. No rash noted. Psychiatric: Mood and affect are normal. Speech and behavior are normal.  ____________________________________________   LABS (all labs ordered are listed, but only abnormal results are displayed)  Labs Reviewed - No data to display ____________________________________________  EKG   ____________________________________________  RADIOLOGY   ____________________________________________   PROCEDURES  Procedure(s) performed: None  Procedures  Critical Care performed: No  ____________________________________________   INITIAL IMPRESSION / ASSESSMENT AND PLAN / ED COURSE  Pertinent labs & imaging results that were available during my care of the patient were reviewed by me and considered in my medical decision making (see chart for details).  Dental pain secondary to fractured tooth. Patient given discharge Instructions. Patient started on amoxicillin and Percocets. Patient advised to follow-up with scheduled appointment.  Clinical Course     ____________________________________________   FINAL CLINICAL IMPRESSION(S) / ED DIAGNOSES  Final diagnoses:  Pain, dental  Dental  abscess      NEW MEDICATIONS STARTED DURING THIS VISIT:  New Prescriptions   AMOXICILLIN (AMOXIL) 500 MG CAPSULE    Take 1 capsule (500 mg total) by mouth 3 (three) times daily.   IBUPROFEN (ADVIL,MOTRIN) 600 MG TABLET    Take 1 tablet (600 mg total) by mouth every 8 (eight) hours as needed.   OXYCODONE-ACETAMINOPHEN (ROXICET) 5-325 MG TABLET    Take 1 tablet by mouth every 6 (six) hours as needed for moderate pain.     Note:  This document was prepared using Dragon voice recognition software and may include unintentional dictation errors.    Joni Reining, PA-C 08/10/15 2013    Loleta Rose, MD 08/11/15 [phone]

## 2015-08-10 NOTE — ED Triage Notes (Signed)
C/o left sided lower toothache X 2 days. Has appt with dentist on Thursday. Pt alert and oriented X4, active, cooperative, pt in NAD. RR even and unlabored, color WNL.

## 2016-10-30 ENCOUNTER — Emergency Department: Payer: Medicare Other

## 2016-10-30 ENCOUNTER — Encounter: Payer: Self-pay | Admitting: Emergency Medicine

## 2016-10-30 ENCOUNTER — Encounter
Admission: EM | Disposition: A | Discharge status: Home / Self Care | Payer: Self-pay | Source: Home / Self Care | Attending: Internal Medicine

## 2016-10-30 ENCOUNTER — Inpatient Hospital Stay
Admission: EM | Admit: 2016-10-30 | Discharge: 2016-11-01 | DRG: 247 | Disposition: A | Discharge status: Home / Self Care | Payer: Medicare Other | Attending: Internal Medicine | Admitting: Internal Medicine

## 2016-10-30 DIAGNOSIS — F209 Schizophrenia, unspecified: Secondary | ICD-10-CM | POA: Diagnosis present

## 2016-10-30 DIAGNOSIS — I2510 Atherosclerotic heart disease of native coronary artery without angina pectoris: Secondary | ICD-10-CM

## 2016-10-30 DIAGNOSIS — I2101 ST elevation (STEMI) myocardial infarction involving left main coronary artery: Secondary | ICD-10-CM

## 2016-10-30 DIAGNOSIS — G629 Polyneuropathy, unspecified: Secondary | ICD-10-CM | POA: Diagnosis present

## 2016-10-30 DIAGNOSIS — Z7951 Long term (current) use of inhaled steroids: Secondary | ICD-10-CM | POA: Diagnosis not present

## 2016-10-30 DIAGNOSIS — I213 ST elevation (STEMI) myocardial infarction of unspecified site: Secondary | ICD-10-CM

## 2016-10-30 DIAGNOSIS — I255 Ischemic cardiomyopathy: Secondary | ICD-10-CM | POA: Diagnosis present

## 2016-10-30 DIAGNOSIS — K219 Gastro-esophageal reflux disease without esophagitis: Secondary | ICD-10-CM | POA: Diagnosis present

## 2016-10-30 DIAGNOSIS — F419 Anxiety disorder, unspecified: Secondary | ICD-10-CM | POA: Diagnosis present

## 2016-10-30 DIAGNOSIS — E7800 Pure hypercholesterolemia, unspecified: Secondary | ICD-10-CM | POA: Diagnosis present

## 2016-10-30 DIAGNOSIS — F17210 Nicotine dependence, cigarettes, uncomplicated: Secondary | ICD-10-CM | POA: Diagnosis present

## 2016-10-30 DIAGNOSIS — F203 Undifferentiated schizophrenia: Secondary | ICD-10-CM | POA: Diagnosis not present

## 2016-10-30 DIAGNOSIS — I472 Ventricular tachycardia: Secondary | ICD-10-CM | POA: Diagnosis present

## 2016-10-30 DIAGNOSIS — Z79899 Other long term (current) drug therapy: Secondary | ICD-10-CM

## 2016-10-30 DIAGNOSIS — I10 Essential (primary) hypertension: Secondary | ICD-10-CM | POA: Diagnosis present

## 2016-10-30 DIAGNOSIS — Z8249 Family history of ischemic heart disease and other diseases of the circulatory system: Secondary | ICD-10-CM | POA: Diagnosis not present

## 2016-10-30 DIAGNOSIS — E785 Hyperlipidemia, unspecified: Secondary | ICD-10-CM | POA: Diagnosis present

## 2016-10-30 DIAGNOSIS — Z803 Family history of malignant neoplasm of breast: Secondary | ICD-10-CM | POA: Diagnosis not present

## 2016-10-30 DIAGNOSIS — I2102 ST elevation (STEMI) myocardial infarction involving left anterior descending coronary artery: Secondary | ICD-10-CM | POA: Diagnosis present

## 2016-10-30 DIAGNOSIS — I340 Nonrheumatic mitral (valve) insufficiency: Secondary | ICD-10-CM | POA: Diagnosis not present

## 2016-10-30 HISTORY — PX: CORONARY/GRAFT ACUTE MI REVASCULARIZATION: CATH118305

## 2016-10-30 HISTORY — PX: LEFT HEART CATH AND CORONARY ANGIOGRAPHY: CATH118249

## 2016-10-30 LAB — COMPREHENSIVE METABOLIC PANEL
ALK PHOS: 150 U/L — AB (ref 38–126)
ALT: 21 U/L (ref 17–63)
AST: 25 U/L (ref 15–41)
Albumin: 3.8 g/dL (ref 3.5–5.0)
Anion gap: 2 — ABNORMAL LOW (ref 5–15)
BUN: 7 mg/dL (ref 6–20)
CALCIUM: 9.4 mg/dL (ref 8.9–10.3)
CO2: 28 mmol/L (ref 22–32)
CREATININE: 1.04 mg/dL (ref 0.61–1.24)
Chloride: 104 mmol/L (ref 101–111)
GFR calc Af Amer: >60 mL/min (ref >60)
Glucose, Bld: 125 mg/dL — ABNORMAL HIGH (ref 65–99)
Potassium: 3.5 mmol/L (ref 3.5–5.1)
Sodium: 134 mmol/L — ABNORMAL LOW (ref 135–145)
TOTAL PROTEIN: 8.2 g/dL — AB (ref 6.5–8.1)
Total Bilirubin: 0.5 mg/dL (ref 0.3–1.2)

## 2016-10-30 LAB — PROTIME-INR
INR: 1.02
Prothrombin Time: 13.3 seconds (ref 11.4–15.2)

## 2016-10-30 LAB — CBC WITH DIFFERENTIAL/PLATELET
BASOS ABS: 0.1 10*3/uL (ref 0–0.1)
Basophils Relative: 1 %
EOS ABS: 0.3 10*3/uL (ref 0–0.7)
EOS PCT: 2 %
HCT: 49.8 % (ref 40.0–52.0)
Hemoglobin: 16.8 g/dL (ref 13.0–18.0)
LYMPHS PCT: 33 %
Lymphs Abs: 4.7 10*3/uL — ABNORMAL HIGH (ref 1.0–3.6)
MCH: 30.1 pg (ref 26.0–34.0)
MCHC: 33.7 g/dL (ref 32.0–36.0)
MCV: 89.3 fL (ref 80.0–100.0)
MONO ABS: 0.8 10*3/uL (ref 0.2–1.0)
Monocytes Relative: 6 %
Neutro Abs: 8.4 10*3/uL — ABNORMAL HIGH (ref 1.4–6.5)
Neutrophils Relative %: 58 %
Platelets: 300 10*3/uL (ref 150–440)
RBC: 5.58 MIL/uL (ref 4.40–5.90)
RDW: 14 % (ref 11.5–14.5)
WBC: 14.3 10*3/uL — ABNORMAL HIGH (ref 3.8–10.6)

## 2016-10-30 LAB — LIPID PANEL
CHOL/HDL RATIO: 4.7 ratio
CHOLESTEROL: 137 mg/dL (ref 0–200)
HDL: 29 mg/dL — AB (ref >40)
LDL Cholesterol: 77 mg/dL (ref 0–99)
Triglycerides: 156 mg/dL — ABNORMAL HIGH (ref <150)
VLDL: 31 mg/dL (ref 0–40)

## 2016-10-30 LAB — APTT: APTT: 30 s (ref 24–36)

## 2016-10-30 LAB — CREATININE, SERUM: CREATININE: 0.91 mg/dL (ref 0.61–1.24)

## 2016-10-30 LAB — CBC
HEMATOCRIT: 43.3 % (ref 40.0–52.0)
HEMOGLOBIN: 15 g/dL (ref 13.0–18.0)
MCH: 30.4 pg (ref 26.0–34.0)
MCHC: 34.5 g/dL (ref 32.0–36.0)
MCV: 88 fL (ref 80.0–100.0)
Platelets: 225 10*3/uL (ref 150–440)
RBC: 4.92 MIL/uL (ref 4.40–5.90)
RDW: 14.1 % (ref 11.5–14.5)
WBC: 18.4 10*3/uL — AB (ref 3.8–10.6)

## 2016-10-30 LAB — TROPONIN I
TROPONIN I: 0.04 ng/mL — AB (ref <0.03)
TROPONIN I: 2.86 ng/mL — AB (ref <0.03)
Troponin I: 1.51 ng/mL (ref <0.03)

## 2016-10-30 LAB — MRSA PCR SCREENING: MRSA BY PCR: NEGATIVE

## 2016-10-30 LAB — POCT ACTIVATED CLOTTING TIME: ACTIVATED CLOTTING TIME: 312 s

## 2016-10-30 SURGERY — LEFT HEART CATH AND CORONARY ANGIOGRAPHY
Anesthesia: Moderate Sedation

## 2016-10-30 MED ORDER — TICAGRELOR 90 MG PO TABS
ORAL_TABLET | ORAL | Status: AC
  Filled 2016-10-30: qty 2

## 2016-10-30 MED ORDER — SODIUM CHLORIDE 0.9 % IV SOLN: 250.0000 mL | INTRAVENOUS | Status: DC | PRN

## 2016-10-30 MED ORDER — ALBUTEROL SULFATE HFA 108 (90 BASE) MCG/ACT IN AERS: 2.0000 | INHALATION_SPRAY | RESPIRATORY_TRACT | Status: DC | PRN

## 2016-10-30 MED ORDER — ONDANSETRON HCL 4 MG/2ML IJ SOLN: 4.0000 mg | Freq: Four times a day (QID) | INTRAMUSCULAR | Status: DC | PRN

## 2016-10-30 MED ORDER — SODIUM CHLORIDE 0.9% FLUSH
3.0000 mL | Freq: Two times a day (BID) | INTRAVENOUS | Status: DC
  Administered 2016-10-31 (×2): 3 mL via INTRAVENOUS

## 2016-10-30 MED ORDER — IPRATROPIUM-ALBUTEROL 0.5-2.5 (3) MG/3ML IN SOLN
3.0000 mL | Freq: Four times a day (QID) | RESPIRATORY_TRACT | Status: DC
  Administered 2016-10-31: 3 mL via RESPIRATORY_TRACT
  Filled 2016-10-30 (×2): qty 3

## 2016-10-30 MED ORDER — OLANZAPINE 7.5 MG PO TABS
15.0000 mg | ORAL_TABLET | Freq: Two times a day (BID) | ORAL | Status: DC
  Administered 2016-10-30 – 2016-11-01 (×4): 15 mg via ORAL
  Filled 2016-10-30 (×5): qty 2

## 2016-10-30 MED ORDER — HEPARIN SODIUM (PORCINE) 5000 UNIT/ML IJ SOLN
60.0000 [IU]/kg | Freq: Once | INTRAMUSCULAR | Status: AC
  Administered 2016-10-30: 4000 [IU] via INTRAVENOUS
  Filled 2016-10-30: qty 1

## 2016-10-30 MED ORDER — OXYCODONE-ACETAMINOPHEN 5-325 MG PO TABS: 1.0000 | ORAL_TABLET | Freq: Four times a day (QID) | ORAL | Status: DC | PRN

## 2016-10-30 MED ORDER — SODIUM CHLORIDE 0.9 % IV SOLN
INTRAVENOUS | Status: AC
  Administered 2016-10-30: 17:00:00 via INTRAVENOUS

## 2016-10-30 MED ORDER — FENTANYL CITRATE (PF) 100 MCG/2ML IJ SOLN: 50.0000 ug | Freq: Once | INTRAMUSCULAR | Status: DC

## 2016-10-30 MED ORDER — PANTOPRAZOLE SODIUM 40 MG PO TBEC
40.0000 mg | DELAYED_RELEASE_TABLET | Freq: Every day | ORAL | Status: DC
  Administered 2016-10-30 – 2016-11-01 (×3): 40 mg via ORAL
  Filled 2016-10-30 (×3): qty 1

## 2016-10-30 MED ORDER — HYDROXYZINE HCL 50 MG PO TABS
50.0000 mg | ORAL_TABLET | Freq: Four times a day (QID) | ORAL | Status: DC | PRN
  Filled 2016-10-30: qty 1

## 2016-10-30 MED ORDER — ATORVASTATIN CALCIUM 20 MG PO TABS
40.0000 mg | ORAL_TABLET | Freq: Every day | ORAL | Status: DC
  Administered 2016-10-30 – 2016-11-01 (×3): 40 mg via ORAL
  Filled 2016-10-30 (×3): qty 2

## 2016-10-30 MED ORDER — SODIUM CHLORIDE 0.9 % IV SOLN
INTRAVENOUS | Status: DC
Start: 2016-10-30 — End: 2016-10-30
  Administered 2016-10-30: 20 mL/h via INTRAVENOUS

## 2016-10-30 MED ORDER — IOPAMIDOL (ISOVUE-300) INJECTION 61%
INTRAVENOUS | Status: DC | PRN
  Administered 2016-10-30: 145 mL via INTRA_ARTERIAL

## 2016-10-30 MED ORDER — HALOPERIDOL 5 MG PO TABS
5.0000 mg | ORAL_TABLET | Freq: Two times a day (BID) | ORAL | Status: DC
  Administered 2016-10-30 – 2016-10-31 (×2): 5 mg via ORAL
  Filled 2016-10-30 (×3): qty 1

## 2016-10-30 MED ORDER — MIDAZOLAM HCL 2 MG/2ML IJ SOLN
INTRAMUSCULAR | Status: AC
  Filled 2016-10-30: qty 2

## 2016-10-30 MED ORDER — BUDESONIDE 0.5 MG/2ML IN SUSP
1.0000 mg | Freq: Two times a day (BID) | RESPIRATORY_TRACT | Status: DC
  Administered 2016-10-30: 1 mg via RESPIRATORY_TRACT
  Administered 2016-10-31: 0.5 mg via RESPIRATORY_TRACT
  Administered 2016-10-31 – 2016-11-01 (×2): 1 mg via RESPIRATORY_TRACT
  Filled 2016-10-30 (×4): qty 4

## 2016-10-30 MED ORDER — SODIUM CHLORIDE 0.9% FLUSH: 3.0000 mL | INTRAVENOUS | Status: DC | PRN

## 2016-10-30 MED ORDER — VERAPAMIL HCL 2.5 MG/ML IV SOLN
INTRAVENOUS | Status: AC
  Filled 2016-10-30: qty 2

## 2016-10-30 MED ORDER — BENZTROPINE MESYLATE 0.5 MG PO TABS
0.5000 mg | ORAL_TABLET | Freq: Two times a day (BID) | ORAL | Status: DC
  Administered 2016-10-30 – 2016-11-01 (×4): 0.5 mg via ORAL
  Filled 2016-10-30 (×5): qty 1

## 2016-10-30 MED ORDER — ENOXAPARIN SODIUM 40 MG/0.4ML ~~LOC~~ SOLN
40.0000 mg | SUBCUTANEOUS | Status: DC
  Administered 2016-10-31: 40 mg via SUBCUTANEOUS
  Filled 2016-10-30 (×2): qty 0.4

## 2016-10-30 MED ORDER — HEPARIN SODIUM (PORCINE) 1000 UNIT/ML IJ SOLN
INTRAMUSCULAR | Status: DC | PRN
Start: 2016-10-30 — End: 2016-10-30
  Administered 2016-10-30: 4000 [IU] via INTRAVENOUS

## 2016-10-30 MED ORDER — NITROGLYCERIN 1 MG/10 ML FOR IR/CATH LAB
INTRA_ARTERIAL | Status: DC | PRN
  Administered 2016-10-30: 200 ug via INTRACORONARY

## 2016-10-30 MED ORDER — HEPARIN SODIUM (PORCINE) 1000 UNIT/ML IJ SOLN
INTRAMUSCULAR | Status: AC
  Filled 2016-10-30: qty 1

## 2016-10-30 MED ORDER — TICAGRELOR 90 MG PO TABS
ORAL_TABLET | ORAL | Status: DC | PRN
  Administered 2016-10-30: 180 mg via ORAL

## 2016-10-30 MED ORDER — ALBUTEROL SULFATE (2.5 MG/3ML) 0.083% IN NEBU: 2.5000 mg | INHALATION_SOLUTION | RESPIRATORY_TRACT | Status: DC | PRN

## 2016-10-30 MED ORDER — CARVEDILOL 3.125 MG PO TABS
3.1250 mg | ORAL_TABLET | Freq: Two times a day (BID) | ORAL | Status: DC
  Administered 2016-10-30 – 2016-10-31 (×2): 3.125 mg via ORAL
  Filled 2016-10-30 (×2): qty 1

## 2016-10-30 MED ORDER — TICAGRELOR 90 MG PO TABS
90.0000 mg | ORAL_TABLET | Freq: Two times a day (BID) | ORAL | Status: DC
  Administered 2016-10-31 – 2016-11-01 (×3): 90 mg via ORAL
  Filled 2016-10-30 (×4): qty 1

## 2016-10-30 MED ORDER — MIDAZOLAM HCL 2 MG/2ML IJ SOLN
INTRAMUSCULAR | Status: DC | PRN
Start: 2016-10-30 — End: 2016-10-30
  Administered 2016-10-30: 1 mg via INTRAVENOUS

## 2016-10-30 MED ORDER — HYDROCODONE-ACETAMINOPHEN 5-325 MG PO TABS: 1.0000 | ORAL_TABLET | ORAL | Status: DC | PRN

## 2016-10-30 MED ORDER — ASPIRIN 81 MG PO CHEW
81.0000 mg | CHEWABLE_TABLET | Freq: Every day | ORAL | Status: DC
  Administered 2016-10-30 – 2016-11-01 (×3): 81 mg via ORAL
  Filled 2016-10-30 (×3): qty 1

## 2016-10-30 MED ORDER — LORAZEPAM 1 MG PO TABS
1.0000 mg | ORAL_TABLET | Freq: Two times a day (BID) | ORAL | Status: DC
  Administered 2016-10-30 – 2016-11-01 (×4): 1 mg via ORAL
  Filled 2016-10-30 (×4): qty 1

## 2016-10-30 MED ORDER — NITROGLYCERIN 5 MG/ML IV SOLN
INTRAVENOUS | Status: AC
  Filled 2016-10-30: qty 10

## 2016-10-30 MED ORDER — GABAPENTIN 400 MG PO CAPS
400.0000 mg | ORAL_CAPSULE | Freq: Three times a day (TID) | ORAL | Status: DC
  Administered 2016-10-30 – 2016-11-01 (×6): 400 mg via ORAL
  Filled 2016-10-30 (×6): qty 1

## 2016-10-30 MED ORDER — VERAPAMIL HCL 2.5 MG/ML IV SOLN
INTRAVENOUS | Status: DC | PRN
  Administered 2016-10-30: 2.5 mg via INTRA_ARTERIAL

## 2016-10-30 SURGICAL SUPPLY — 15 items
BALLN TREK RX 2.5X12 (BALLOONS) ×2
BALLN ~~LOC~~ EUPHORA RX 3.25X15 (BALLOONS) ×2
BALLOON TREK RX 2.5X12 (BALLOONS) ×1 IMPLANT
BALLOON ~~LOC~~ EUPHORA RX 3.25X15 (BALLOONS) ×1 IMPLANT
CATH HEARTRAIL 6F IL3.5 (CATHETERS) ×2 IMPLANT
CATH INFINITI 5FR ANG PIGTAIL (CATHETERS) ×2 IMPLANT
DEVICE INFLAT 30 PLUS (MISCELLANEOUS) ×2 IMPLANT
DEVICE RAD COMP TR BAND LRG (VASCULAR PRODUCTS) ×2 IMPLANT
GLIDESHEATH SLEND SS 6F .021 (SHEATH) ×2 IMPLANT
KIT MANI 3VAL PERCEP (MISCELLANEOUS) ×2 IMPLANT
PACK CARDIAC CATH (CUSTOM PROCEDURE TRAY) ×2 IMPLANT
STENT RESOLUTE ONYX 3.0X18 (Permanent Stent) ×2 IMPLANT
WIRE HITORQ VERSACORE ST 145CM (WIRE) ×2 IMPLANT
WIRE ROSEN-J .035X260CM (WIRE) ×2 IMPLANT
WIRE RUNTHROUGH .014X180CM (WIRE) ×2 IMPLANT

## 2016-10-30 NOTE — Progress Notes (Signed)
CH responded to a PG for a Code Stemi. Pt is awake and alert. MD feels Pt needs to go to the cath lab. Pt asked for his father to be contacted. CH contacted the father. Father asked if someone would notify him of the results (as he lives 3 hours away). CH informed cath lab RN who will call with results. Pt did not indicate a need for further spiritual support.    10/30/16 1412  Clinical Encounter Type  Visited With Patient;Health care provider  Visit Type Initial;Spiritual support  Referral From Nurse  Consult/Referral To Chaplain  Spiritual Encounters  Spiritual Needs Emotional  Advance Directives (For Healthcare)  Does Patient Have a Medical Advance Directive? No  Would patient like information on creating a medical advance directive? No - Patient declined  Mental Health Advance Directives  Does Patient Have a Mental Health Advance Directive? No  Would patient like information on creating a mental health advance directive? No - Patient declined

## 2016-10-30 NOTE — ED Triage Notes (Signed)
Arrives via EMS.  EMS called for c/o Chest Pain.  Initially patient was in a ST 120-130's.  Pain did improve.  Received NTG 1 spray and 324 ASA PTA.  EMS saw STEMI on EKG-- ST elevation v2-6.  Patient arrives with minimal c/o pain 4/10.

## 2016-10-30 NOTE — ED Provider Notes (Addendum)
[facility] Emergency Department Provider Note  ____________________________________________   I have reviewed the triage vital signs and the nursing notes.   HISTORY  Chief Complaint Chest Pain    HPI [name] is a 46 y.o. male with a history of hypertension and tobacco abuse as well as hypercholesterol never had any personal history of ACS, states he was smoking a cigarette came inside and started having gradual onset left-sided chest pain radiating to left arm as a gripping significant discomfort. He became diaphoretic and short of breath. He is feeling much better at this time pain is down to a 4 after nitroglycerin, he had aspirin from EMS. Initial EMS EKG showed no ST elevation however en route they repeated it and he had very significant to concerning ST elevation of 345 and 6,  patient states that nothing makes the pain better except for the nitroglycerin, he's been having off-and-on exertional chest pain he states for the last month but "didn't think anything of it". The same pain she had today only more pronounced.  he denies any pleuritic chest pain radiation to his back, it was not maximum intensity at onset, he denies any leg pain or swelling, it happened at rest.   Past Medical History:  Diagnosis Date  . Hypercholesteremia   . Schizophrenia [facility])     Patient Active Problem List   Diagnosis Date Noted  . Biliary colic   . Encounter for fitting of gastrointestinal device   . Abdominal pain, acute   . Hyperbilirubinemia   . Choledocholithiasis 09/19/2014  . Transaminitis     Past Surgical History:  Procedure Laterality Date  . CHOLECYSTECTOMY N/A 09/20/2014   Procedure: LAPAROSCOPIC CHOLECYSTECTOMY;  Surgeon: Natale Lay, MD;  Location: [facility] ORS;  Service: General;  Laterality: N/A;  . ENDOSCOPIC RETROGRADE CHOLANGIOPANCREATOGRAPHY (ERCP) WITH PROPOFOL N/A 09/22/2014   Procedure: ENDOSCOPIC RETROGRADE CHOLANGIOPANCREATOGRAPHY (ERCP) WITH  PROPOFOL;  Surgeon: Wallace Cullens, MD;  Location: [facility] ENDOSCOPY;  Service: Endoscopy;  Laterality: N/A;  . ERCP N/A 09/25/2014   Procedure: ENDOSCOPIC RETROGRADE CHOLANGIOPANCREATOGRAPHY (ERCP);  Surgeon: Midge Minium, MD;  Location: [facility] ENDOSCOPY;  Service: Endoscopy;  Laterality: N/A;    Prior to Admission medications   Medication Sig Start Date End Date Taking? Authorizing Provider  albuterol (PROVENTIL HFA;VENTOLIN HFA) 108 (90 BASE) MCG/ACT inhaler Inhale 2 puffs into the lungs every 4 (four) hours as needed for wheezing or shortness of breath.    [provider]  amoxicillin (AMOXIL) 500 MG capsule Take 1 capsule (500 mg total) by mouth 3 (three) times daily. 08/10/15   Joni Reining, PA-C  atorvastatin (LIPITOR) 40 MG tablet Take 40 mg by mouth daily.    [provider]  benztropine (COGENTIN) 0.5 MG tablet Take 0.5 mg by mouth 2 (two) times daily.    [provider]  esomeprazole (NEXIUM) 40 MG capsule Take 40 mg by mouth daily.     [provider]  fluticasone (FLOVENT HFA) 110 MCG/ACT inhaler Inhale 2 puffs into the lungs 2 (two) times daily.    [provider]  gabapentin (NEURONTIN) 400 MG capsule Take 400 mg by mouth 3 (three) times daily.    [provider]  haloperidol (HALDOL) 5 MG tablet Take 5 mg by mouth 2 (two) times daily.    [provider]  HYDROcodone-acetaminophen (NORCO/VICODIN) 5-325 MG per tablet Take 1-2 tablets by mouth every 4 (four) hours as needed for moderate pain. 09/26/14   Gladis Riffle, MD  hydrOXYzine (VISTARIL)  50 MG capsule Take 50 mg by mouth 4 (four) times daily as needed for anxiety (agitation. Limit 4 caps per 24 hours.).    [provider]  ibuprofen (ADVIL,MOTRIN) 600 MG tablet Take 1 tablet (600 mg total) by mouth every 8 (eight) hours as needed. 08/10/15   Joni ReiningSmith, Ronald K, PA-C  LORazepam (ATIVAN) 1 MG tablet Take 1 mg by mouth 2 (two) times daily.     [provider]   OLANZapine (ZYPREXA) 15 MG tablet Take 15 mg by mouth 2 (two) times daily.    [provider]  oxyCODONE-acetaminophen (ROXICET) 5-325 MG tablet Take 1 tablet by mouth every 6 (six) hours as needed for moderate pain. 08/10/15   Joni ReiningSmith, Ronald K, PA-C  ranitidine (ZANTAC) 150 MG capsule Take 150 mg by mouth 2 (two) times daily.    [provider]    Allergies Patient has no known allergies.  Family History  Problem Relation Age of Onset  . Heart murmur Mother     Social History Social History  Substance Use Topics  . Smoking status: Current Every Day Smoker    Packs/day: 1.00    Types: Cigarettes  . Smokeless tobacco: Never Used  . Alcohol use No    Review of Systems Constitutional: No fever/chills Eyes: No visual changes. ENT: No sore throat. No stiff neck no neck pain Cardiovascular: positivechest pain. Respiratory: positiveshortness of breath. Gastrointestinal:   no vomiting.  No diarrhea.  No constipation. Genitourinary: Negative for dysuria. Musculoskeletal: Negative lower extremity swelling Skin: Negative for rash. Neurological: Negative for severe headaches, focal weakness or numbness.   ____________________________________________   PHYSICAL EXAM:  VITAL SIGNS: ED Triage Vitals [10/30/16 1408]  Enc Vitals Group     BP      Pulse      Resp      Temp      Temp src      SpO2      Weight 147 lb (66.7 kg)     Height 5\' 8"  (1.727 m)     Head Circumference      Peak Flow      Pain Score 0     Pain Loc      Pain Edu?      Excl. in GC?     Constitutional: Alert and oriented. Well appearing and in no acute distress. Eyes: Conjunctivae are normal Head: Atraumatic HEENT: No congestion/rhinnorhea. Mucous membranes are moist.  Oropharynx non-erythematous Neck:   Nontender with no meningismus, no masses, no stridor Cardiovascular: Normal rate, regular rhythm. Grossly normal heart sounds.  Good peripheral circulation. Respiratory: Normal  respiratory effort.  No retractions. Lungs CTAB. Abdominal: Soft and nontender. No distention. No guarding no rebound Back:  There is no focal tenderness or step off.  there is no midline tenderness there are no lesions noted. there is no CVA tenderness Musculoskeletal: No lower extremity tenderness, no upper extremity tenderness. No joint effusions, no DVT signs strong distal pulses no edema Neurologic:  Normal speech and language. No gross focal neurologic deficits are appreciated.  Skin:  Skin is warm, dry and intact. No rash noted. Psychiatric: Mood and affect are normal. Speech and behavior are normal.  ____________________________________________   LABS (all labs ordered are listed, but only abnormal results are displayed)  Labs Reviewed  CBC WITH DIFFERENTIAL/PLATELET  PROTIME-INR  APTT  COMPREHENSIVE METABOLIC PANEL  TROPONIN I  LIPID PANEL    Pertinent labs  results that were available during my care of the  patient were reviewed by me and considered in my medical decision making (see chart for details). ____________________________________________  EKG  I personally interpreted any EKGs ordered by me or triage EKG from EMS shows sinus rhythm, rate82 bpm with very significant ST elevation in 234 and 5, consistent with ST elevation myocardial infarction. EKG here, shows sinus rhythm at 74 bpm, there is some borderline ST elevation still present in V2 V3 , normal axis, rate 74, sinus.  repeat EKG shows rate 89 bpm, ST elevations very much reduced if present at all, V2 V3 still looksomewhat suspect maybe half a millimeter, normal axis ____________________________________________  RADIOLOGY  Pertinent labs & imaging results that were available during my care of the patient were reviewed by me and considered in my medical decision making (see chart for details). If possible, patient and/or family made aware of any abnormal findings. ____________________________________________     PROCEDURES  Procedure(s) performed: None  Procedures  Critical Care performed: CRITICAL CARE Performed by: Jeanmarie Plant   Total critical care time: 38 minutes  Critical care time was exclusive of separately billable procedures and treating other patients.  Critical care was necessary to treat or prevent imminent or life-threatening deterioration.  Critical care was time spent personally by me on the following activities: development of treatment plan with patient and/or surrogate as well as nursing, discussions with consultants, evaluation of patient's response to treatment, examination of patient, obtaining history from patient or surrogate, ordering and performing treatments and interventions, ordering and review of laboratory studies, ordering and review of radiographic studies, pulse oximetry and re-evaluation of patient's condition.   ____________________________________________   INITIAL IMPRESSION / ASSESSMENT AND PLAN / ED COURSE  Pertinent labs & imaging results that were available during my care of the patient were reviewed by me and considered in my medical decision making (see chart for details).   ----------------------------------------- 2:14 PM on 10/30/2016 -----------------------------------------  Talked to dr. Kirke Corin who agrees w/ heparin and cath  ----------------------------------------- 2:16 PM on 10/30/2016 -----------------------------------------  Agent with a clear STEMI on EKG EMS, after nitroglycerin the ST elevations have largely resolved although there are still some residual present in V2 V3, I do think that the patient needs a cart Given his history physical and findings, I have talked to Dr. Hilary Hertz they will come and perform a catheterization as a STEMI. STEMI was initiated the minute the patient arrived.  ----------------------------------------- 2:31 PM on 10/30/2016 -----------------------------------------  Dr. Kirke Corin at bedside  agrees w/ Michaelle Copas.     ____________________________________________   FINAL CLINICAL IMPRESSION(S) / ED DIAGNOSES  Final diagnoses:  None      This chart was dictated using voice recognition software.  Despite best efforts to proofread,  errors can occur which can change meaning.      Jeanmarie Plant, MD 10/30/16 1417    Jeanmarie Plant, MD 10/30/16 1420    Jeanmarie Plant, MD 10/30/16 1431    Jeanmarie Plant, MD 10/30/16 1431    Jeanmarie Plant, MD 10/30/16 1434    Jeanmarie Plant, MD 10/30/16 1440

## 2016-10-30 NOTE — H&P (Signed)
[employer] [facility] [place] [facility]   PATIENT NAME: [name]    MR#:  [id number]  DATE OF BIRTH:  04-Apr-1970  DATE OF ADMISSION:  10/30/2016  PRIMARY CARE PHYSICIAN: [facility]   REQUESTING/REFERRING PHYSICIAN: Dr. Kirke Corin.   CHIEF COMPLAINT:   Chief Complaint  Patient presents with  . Chest Pain    HISTORY OF PRESENT ILLNESS:  [name]  is a 46 y.o. male with a known history of hyperlipidemia, schizophrenia who presented to the hospital this afternoon due to chest pain. Patient was in his usual state of health and this afternoon developed substernal chest pain which tingling in his left upper extremity. He also had associated nausea and diaphoresis, EMS was called at his group home and he was brought to the ER. On route the EMS on the EKG patient was noted to have ST elevations in the anterior leads and leads V2 34 and 5. Patient was emergently rushed to the cardiac catheterization laboratory and and was noted to have a mid LAD lesion to which she had a stent placed. Post cardiac catheterization patient is currently chest pain-free and hemodynamically stable. Hospitalist services were contacted for admission. Patient denies any shortness of breath, nausea, vomiting, abdominal pain, fever, chills or any other associated symptoms presently.  PAST MEDICAL HISTORY:   Past Medical History:  Diagnosis Date  . Hypercholesteremia   . Schizophrenia (HCC)     PAST SURGICAL HISTORY:   Past Surgical History:  Procedure Laterality Date  . CHOLECYSTECTOMY N/A 09/20/2014   Procedure: LAPAROSCOPIC CHOLECYSTECTOMY;  Surgeon: Natale Lay, MD;  Location: [facility] ORS;  Service: General;  Laterality: N/A;  . ENDOSCOPIC RETROGRADE CHOLANGIOPANCREATOGRAPHY (ERCP) WITH PROPOFOL N/A 09/22/2014   Procedure: ENDOSCOPIC RETROGRADE CHOLANGIOPANCREATOGRAPHY (ERCP) WITH PROPOFOL;  Surgeon: Wallace Cullens, MD;  Location: [facility] ENDOSCOPY;  Service: Endoscopy;  Laterality: N/A;   . ERCP N/A 09/25/2014   Procedure: ENDOSCOPIC RETROGRADE CHOLANGIOPANCREATOGRAPHY (ERCP);  Surgeon: Midge Minium, MD;  Location: [facility] ENDOSCOPY;  Service: Endoscopy;  Laterality: N/A;    SOCIAL HISTORY:   Social History  Substance Use Topics  . Smoking status: Current Every Day Smoker    Packs/day: 1.00    Years: 30.00    Types: Cigarettes  . Smokeless tobacco: Never Used  . Alcohol use No    FAMILY HISTORY:   Family History  Problem Relation Age of Onset  . Heart murmur Mother   . Heart attack Mother   . Breast cancer Mother     DRUG ALLERGIES:  No Known Allergies  REVIEW OF SYSTEMS:   Review of Systems  Constitutional: Negative for chills, fever and weight loss.  HENT: Negative for congestion, nosebleeds and tinnitus.   Eyes: Negative for blurred vision, double vision and redness.  Respiratory: Negative for cough, hemoptysis, shortness of breath and wheezing.   Cardiovascular: Positive for chest pain. Negative for orthopnea, leg swelling and PND.  Gastrointestinal: Negative for abdominal pain, diarrhea, melena, nausea and vomiting.  Genitourinary: Negative for dysuria, hematuria and urgency.  Musculoskeletal: Negative for falls and joint pain.  Neurological: Negative for dizziness, tingling, sensory change, focal weakness, seizures, weakness and headaches.  Endo/Heme/Allergies: Negative for polydipsia. Does not bruise/bleed easily.  Psychiatric/Behavioral: Negative for depression and memory loss. The patient is not nervous/anxious.   All other systems reviewed and are negative.   MEDICATIONS AT HOME:   Prior to Admission medications   Medication Sig Start Date End Date Taking? Authorizing Provider  albuterol (PROVENTIL HFA;VENTOLIN HFA) 108 (  90 BASE) MCG/ACT inhaler Inhale 2 puffs into the lungs every 4 (four) hours as needed for wheezing or shortness of breath.    [provider]  amoxicillin (AMOXIL) 500 MG capsule Take 1 capsule (500 mg total) by mouth  3 (three) times daily. 08/10/15   Joni Reining, PA-C  atorvastatin (LIPITOR) 40 MG tablet Take 40 mg by mouth daily.    [provider]  benztropine (COGENTIN) 0.5 MG tablet Take 0.5 mg by mouth 2 (two) times daily.    [provider]  esomeprazole (NEXIUM) 40 MG capsule Take 40 mg by mouth daily.     [provider]  fluticasone (FLOVENT HFA) 110 MCG/ACT inhaler Inhale 2 puffs into the lungs 2 (two) times daily.    [provider]  gabapentin (NEURONTIN) 400 MG capsule Take 400 mg by mouth 3 (three) times daily.    [provider]  haloperidol (HALDOL) 5 MG tablet Take 5 mg by mouth 2 (two) times daily.    [provider]  HYDROcodone-acetaminophen (NORCO/VICODIN) 5-325 MG per tablet Take 1-2 tablets by mouth every 4 (four) hours as needed for moderate pain. 09/26/14   Gladis Riffle, MD  hydrOXYzine (VISTARIL) 50 MG capsule Take 50 mg by mouth 4 (four) times daily as needed for anxiety (agitation. Limit 4 caps per 24 hours.).    [provider]  ibuprofen (ADVIL,MOTRIN) 600 MG tablet Take 1 tablet (600 mg total) by mouth every 8 (eight) hours as needed. 08/10/15   Joni Reining, PA-C  LORazepam (ATIVAN) 1 MG tablet Take 1 mg by mouth 2 (two) times daily.     [provider]  OLANZapine (ZYPREXA) 15 MG tablet Take 15 mg by mouth 2 (two) times daily.    [provider]  oxyCODONE-acetaminophen (ROXICET) 5-325 MG tablet Take 1 tablet by mouth every 6 (six) hours as needed for moderate pain. 08/10/15   Joni Reining, PA-C  ranitidine (ZANTAC) 150 MG capsule Take 150 mg by mouth 2 (two) times daily.    [provider]      VITAL SIGNS:  Blood pressure 109/81, pulse 79, temperature 97.9 F (36.6 C), temperature source Oral, resp. rate 17, height 5\' 8"  (1.727 m), weight 68.7 kg (151 lb 7.3 oz), SpO2 99 %.  PHYSICAL EXAMINATION:  Physical Exam  GENERAL:  46 y.o.-year-old patient lying in the bed with  no acute distress.  EYES: Pupils equal, round, reactive to light and accommodation. No scleral icterus. Extraocular muscles intact.  HEENT: Head atraumatic, normocephalic. Oropharynx and nasopharynx clear. No oropharyngeal erythema, moist oral mucosa  NECK:  Supple, no jugular venous distention. No thyroid enlargement, no tenderness.  LUNGS: Normal breath sounds bilaterally, no wheezing, rales, rhonchi. No use of accessory muscles of respiration.  CARDIOVASCULAR: S1, S2 RRR. No murmurs, rubs, gallops, clicks.  ABDOMEN: Soft, nontender, nondistended. Bowel sounds present. No organomegaly or mass.  EXTREMITIES: No pedal edema, cyanosis, or clubbing. + 2 pedal & radial pulses b/l.   NEUROLOGIC: Cranial nerves II through XII are intact. No focal Motor or sensory deficits appreciated b/l PSYCHIATRIC: The patient is alert and oriented x 3.  SKIN: No obvious rash, lesion, or ulcer.   LABORATORY PANEL:   CBC  Recent Labs Lab 10/30/16 1408  WBC 14.3*  HGB 16.8  HCT 49.8  PLT 300   ------------------------------------------------------------------------------------------------------------------  Chemistries   Recent Labs Lab 10/30/16 1408  NA 134*  K 3.5  CL 104  CO2 28  GLUCOSE 125*  BUN 7  CREATININE 1.04  CALCIUM 9.4  AST 25  ALT 21  ALKPHOS 150*  BILITOT 0.5   ------------------------------------------------------------------------------------------------------------------  Cardiac Enzymes  Recent Labs Lab 10/30/16 1408  TROPONINI 0.04*   ------------------------------------------------------------------------------------------------------------------  RADIOLOGY:  Dg Chest Port 1 View  Result Date: 10/30/2016 CLINICAL DATA:  Acute onset left-sided chest pain, shortness breath, and diaphoresis. EXAM: PORTABLE CHEST 1 VIEW COMPARISON:  None. FINDINGS: The heart size and mediastinal contours are within normal limits. Both lungs are clear. The visualized skeletal  structures are unremarkable. IMPRESSION: No active disease. Electronically Signed   By: Myles RosenthalJohn  Stahl M.D.   On: 10/30/2016 14:30     IMPRESSION AND PLAN:   46 year old male with past medical history of hyperlipidemia, schizophrenia who presented to the hospital with chest pain and noted to have ST elevations in the anterior leads.  1. ST elevation MI-this is a cause of patient's chest pain. Patient underwent emergent cardiac catheterization which showed a mid LAD lesion which was stented. -Patient's chronic chest pain-free and hemodynamically stable. -Continue aspirin, Brillinta, carvedilol, atorvastatin. -Appreciate cardiology help.  2. History of schizophrenia-continue Zyprexa, Haldol.  3. Anxiety-continue Ativan.  4. Neuropathy-continue gabapentin.  5. GERD-continue Protonix.  All the records are reviewed and case discussed with ED provider. Management plans discussed with the patient, family and they are in agreement.  CODE STATUS: Full code  TOTAL TIME TAKING CARE OF THIS PATIENT: 45 minutes.    Houston SirenSAINANI,VIVEK J M.D on 10/30/2016 at 5:00 PM  Between 7am to 6pm - Pager - [phone]  After 6pm go to [email or web address] - password [email or web address] [facility]  [facility]  Office  [phone]  CC: Primary care physician; [facility]

## 2016-10-30 NOTE — H&P (Signed)
Cardiology Consultation:   Patient ID: [name]; [id number]; 01-05-1971   Admit date: 10/30/2016 Date of Consult: 10/30/2016  Primary Care Provider: [facility] Primary Cardiologist: Ellis Parents [name]) Primary Electrophysiologist:  None   Patient Profile:   [name] is a 46 y.o. male with a hx of Schizophrenia, tobacco use and hyperlipidemia who is being seen today for the evaluation of chest pain with abnormal EKG at the request of Dr. Alphonzo Lemmings.  History of Present Illness:   Mr. [name] is a 46 year old male with past medical history of schizophrenia, tobacco use and hyperlipidemia who presented with chest pain. He started having chest pain around 1:00 this afternoon while he was smoking and watching the football game. He lives in a group home. The pain was substernal and severe with radiation to his left arm. This was described as heaviness and tightness and was associated with shortness of breath and diaphoresis. EMS arrived to the scene and the patient was given aspirin and nitroglycerin sublingually. EKG showed initially no significant ST changes. However, in route he was noted to have significant anterior ST elevation. The patient's chest pain resolved after aspirin and sublingual nitroglycerin.  Past Medical History:  Diagnosis Date  . Hypercholesteremia   . Schizophrenia [facility])     Past Surgical History:  Procedure Laterality Date  . CHOLECYSTECTOMY N/A 09/20/2014   Procedure: LAPAROSCOPIC CHOLECYSTECTOMY;  Surgeon: Natale Lay, MD;  Location: [facility] ORS;  Service: General;  Laterality: N/A;  . ENDOSCOPIC RETROGRADE CHOLANGIOPANCREATOGRAPHY (ERCP) WITH PROPOFOL N/A 09/22/2014   Procedure: ENDOSCOPIC RETROGRADE CHOLANGIOPANCREATOGRAPHY (ERCP) WITH PROPOFOL;  Surgeon: Wallace Cullens, MD;  Location: [facility] ENDOSCOPY;  Service: Endoscopy;  Laterality: N/A;  . ERCP N/A 09/25/2014   Procedure: ENDOSCOPIC RETROGRADE CHOLANGIOPANCREATOGRAPHY (ERCP);  Surgeon: Midge Minium,  MD;  Location: [facility] ENDOSCOPY;  Service: Endoscopy;  Laterality: N/A;     Home Medications:  Prior to Admission medications   Medication Sig Start Date End Date Taking? Authorizing Provider  albuterol (PROVENTIL HFA;VENTOLIN HFA) 108 (90 BASE) MCG/ACT inhaler Inhale 2 puffs into the lungs every 4 (four) hours as needed for wheezing or shortness of breath.    [provider]  amoxicillin (AMOXIL) 500 MG capsule Take 1 capsule (500 mg total) by mouth 3 (three) times daily. 08/10/15   Joni Reining, PA-C  atorvastatin (LIPITOR) 40 MG tablet Take 40 mg by mouth daily.    [provider]  benztropine (COGENTIN) 0.5 MG tablet Take 0.5 mg by mouth 2 (two) times daily.    [provider]  esomeprazole (NEXIUM) 40 MG capsule Take 40 mg by mouth daily.     [provider]  fluticasone (FLOVENT HFA) 110 MCG/ACT inhaler Inhale 2 puffs into the lungs 2 (two) times daily.    [provider]  gabapentin (NEURONTIN) 400 MG capsule Take 400 mg by mouth 3 (three) times daily.    [provider]  haloperidol (HALDOL) 5 MG tablet Take 5 mg by mouth 2 (two) times daily.    [provider]  HYDROcodone-acetaminophen (NORCO/VICODIN) 5-325 MG per tablet Take 1-2 tablets by mouth every 4 (four) hours as needed for moderate pain. 09/26/14   Gladis Riffle, MD  hydrOXYzine (VISTARIL) 50 MG capsule Take 50 mg by mouth 4 (four) times daily as needed for anxiety (agitation. Limit 4 caps per 24 hours.).    [provider]  ibuprofen (ADVIL,MOTRIN) 600 MG tablet Take 1 tablet (600 mg total) by mouth every 8 (eight) hours as needed.  08/10/15   Joni ReiningSmith, Ronald K, PA-C  LORazepam (ATIVAN) 1 MG tablet Take 1 mg by mouth 2 (two) times daily.     [provider]  OLANZapine (ZYPREXA) 15 MG tablet Take 15 mg by mouth 2 (two) times daily.    [provider]  oxyCODONE-acetaminophen (ROXICET) 5-325 MG tablet Take 1 tablet by mouth every 6 (six)  hours as needed for moderate pain. 08/10/15   Joni ReiningSmith, Ronald K, PA-C  ranitidine (ZANTAC) 150 MG capsule Take 150 mg by mouth 2 (two) times daily.    [provider]    Inpatient Medications: Scheduled Meds: . [MAR Hold] fentaNYL (SUBLIMAZE) injection  50 mcg Intravenous Once   Continuous Infusions: . sodium chloride 20 mL/hr (10/30/16 1418)   PRN Meds:   Allergies:   No Known Allergies  Social History:   Social History   Social History  . Marital status: Single    Spouse name: N/A  . Number of children: N/A  . Years of education: N/A   Occupational History  . Not on file.   Social History Main Topics  . Smoking status: Current Every Day Smoker    Packs/day: 1.00    Types: Cigarettes  . Smokeless tobacco: Never Used  . Alcohol use No  . Drug use: No  . Sexual activity: Not on file   Other Topics Concern  . Not on file   Social History Narrative  . No narrative on file    Family History:    Family History  Problem Relation Age of Onset  . Heart murmur Mother      ROS:  Please see the history of present illness.  ROS  All other ROS reviewed and negative.     Physical Exam/Data:   Vitals:   10/30/16 1408 10/30/16 1430  BP:  (!) 113/91  Pulse:  86  Resp:  17  SpO2:  98%  Weight: 147 lb (66.7 kg)   Height: 5\' 8"  (1.727 m)    No intake or output data in the 24 hours ending 10/30/16 1607 Filed Weights   10/30/16 1408  Weight: 147 lb (66.7 kg)   Body mass index is 22.35 kg/m.  General:  Well nourished, well developed, in no acute distress HEENT: normal Lymph: no adenopathy Neck: no JVD Endocrine:  No thryomegaly Vascular: No carotid bruits; FA pulses 2+ bilaterally without bruits  Cardiac:  normal S1, S2; RRR; no murmur  Lungs:  clear to auscultation bilaterally, no wheezing, rhonchi or rales  Abd: soft, nontender, no hepatomegaly  Ext: no edema Musculoskeletal:  No deformities, BUE and BLE strength normal and equal Skin: warm and dry   Neuro:  CNs 2-12 intact, no focal abnormalities noted Psych:  Normal affect   EKG:  The EKG was personally reviewed and demonstrates:  Normal sinus rhythm with a 4 mm of anterior ST elevation with reciprocal changes in the inferior leads. Repeat EKG in the ED showed resolution of ST changes.    Relevant CV Studies: Emergent cardiac catheterization: 1. Severe one-vessel coronary artery disease with thrombotic subtotal occlusion of the mid LAD. No other obstructive disease. 2. Moderately reduced LV systolic function with an EF of 35-40% with severe hypokinesis of the mid to distal anterior, apical and distal inferior walls. Mildly elevated left ventricular end-diastolic pressure. 3. Successful angioplasty and drug-eluting stent placement to the mid LAD.  Recommendations: Dual antiplatelet therapy for at least one year. Aggressive treatment of risk factors. Smoking cessation in cardiac rehabilitation is recommended.  Laboratory Data:  Chemistry Recent Labs Lab 10/30/16 1408  NA 134*  K 3.5  CL 104  CO2 28  GLUCOSE 125*  BUN 7  CREATININE 1.04  CALCIUM 9.4  GFRNONAA >60  GFRAA >60  ANIONGAP 2*     Recent Labs Lab 10/30/16 1408  PROT 8.2*  ALBUMIN 3.8  AST 25  ALT 21  ALKPHOS 150*  BILITOT 0.5   Hematology Recent Labs Lab 10/30/16 1408  WBC 14.3*  RBC 5.58  HGB 16.8  HCT 49.8  MCV 89.3  MCH 30.1  MCHC 33.7  RDW 14.0  PLT 300   Cardiac Enzymes Recent Labs Lab 10/30/16 1408  TROPONINI 0.04*   No results for input(s): TROPIPOC in the last 168 hours.  BNPNo results for input(s): BNP, PROBNP in the last 168 hours.  DDimer No results for input(s): DDIMER in the last 168 hours.  Radiology/Studies:  Dg Chest Port 1 View  Result Date: 10/30/2016 CLINICAL DATA:  Acute onset left-sided chest pain, shortness breath, and diaphoresis. EXAM: PORTABLE CHEST 1 VIEW COMPARISON:  None. FINDINGS: The heart size and mediastinal contours are within normal limits. Both  lungs are clear. The visualized skeletal structures are unremarkable. IMPRESSION: No active disease. Electronically Signed   By: Myles Rosenthal M.D.   On: 10/30/2016 14:30    Assessment and Plan:   1. Acute anterior ST elevation myocardial infarction: The patient was given aspirin and unfractionated heparin. I discussed the findings of with the patient and recommended proceeding with emergent cardiac catheterization and possible PCI. I discussed the procedure in detail as well as risks and benefits. Cardiac catheterization was done via the right radial artery which showed thrombotic subtotal occlusion of the mid LAD with no other obstructive disease. This was treated successfully with PCI and drug-eluting stent placement. Ejection fraction was 35-40% with severe anterior, apical and distal inferior wall hypokinesis. Recommend dual antiplatelet therapy for at least one year. I started small dose carvedilol. Continue treatment with atorvastatin. The patient was referred to cardiac rehabilitation and I recommend smoking cessation. Continue observation in the ICU overnight and if stable he can be transferred to telemetry tomorrow. 2. Hyperlipidemia: Continue atorvastatin 40 mg daily. 3. Tobacco use: Smoking cessation is strongly advised 4. Schizophrenia: Continue outpatient medications. 5. I called patient's father and updated him about the condition.   For questions or updates, please contact [facility] Please consult [email or web address] for contact info under Cardiology/STEMI.   Signed, Lorine Bears, MD  10/30/2016 4:07 PM

## 2016-10-30 NOTE — ED Notes (Signed)
Pt to cath lab and report given at bedside

## 2016-10-31 ENCOUNTER — Inpatient Hospital Stay (HOSPITAL_COMMUNITY)
Admit: 2016-10-31 | Discharge: 2016-10-31 | Disposition: A | Discharge status: Home / Self Care | Payer: Medicare Other | Attending: Cardiovascular Disease | Admitting: Cardiovascular Disease

## 2016-10-31 ENCOUNTER — Encounter: Payer: Self-pay | Admitting: *Deleted

## 2016-10-31 DIAGNOSIS — I340 Nonrheumatic mitral (valve) insufficiency: Secondary | ICD-10-CM

## 2016-10-31 DIAGNOSIS — F209 Schizophrenia, unspecified: Secondary | ICD-10-CM

## 2016-10-31 DIAGNOSIS — F203 Undifferentiated schizophrenia: Secondary | ICD-10-CM

## 2016-10-31 LAB — HEMOGLOBIN A1C
Hgb A1c MFr Bld: 5.6 % (ref 4.8–5.6)
MEAN PLASMA GLUCOSE: 114.02 mg/dL

## 2016-10-31 LAB — TROPONIN I: Troponin I: 1.76 ng/mL (ref <0.03)

## 2016-10-31 LAB — BASIC METABOLIC PANEL
Anion gap: 5 (ref 5–15)
BUN: 5 mg/dL — AB (ref 6–20)
CHLORIDE: 109 mmol/L (ref 101–111)
CO2: 26 mmol/L (ref 22–32)
Calcium: 8.6 mg/dL — ABNORMAL LOW (ref 8.9–10.3)
Creatinine, Ser: 0.83 mg/dL (ref 0.61–1.24)
GFR calc Af Amer: >60 mL/min (ref >60)
GFR calc non Af Amer: >60 mL/min (ref >60)
Glucose, Bld: 93 mg/dL (ref 65–99)
POTASSIUM: 3.9 mmol/L (ref 3.5–5.1)
SODIUM: 140 mmol/L (ref 135–145)

## 2016-10-31 LAB — CBC
HEMATOCRIT: 43.5 % (ref 40.0–52.0)
Hemoglobin: 14.8 g/dL (ref 13.0–18.0)
MCH: 30.2 pg (ref 26.0–34.0)
MCHC: 34.1 g/dL (ref 32.0–36.0)
MCV: 88.6 fL (ref 80.0–100.0)
Platelets: 209 10*3/uL (ref 150–440)
RBC: 4.9 MIL/uL (ref 4.40–5.90)
RDW: 14.1 % (ref 11.5–14.5)
WBC: 12.3 10*3/uL — AB (ref 3.8–10.6)

## 2016-10-31 LAB — ECHOCARDIOGRAM COMPLETE
Height: 68 in
Weight: 2423.3 oz

## 2016-10-31 LAB — TSH: TSH: 3.38 u[IU]/mL (ref 0.350–4.500)

## 2016-10-31 LAB — MAGNESIUM: MAGNESIUM: 2 mg/dL (ref 1.7–2.4)

## 2016-10-31 LAB — GLUCOSE, CAPILLARY: Glucose-Capillary: 103 mg/dL — ABNORMAL HIGH (ref 65–99)

## 2016-10-31 MED ORDER — CARVEDILOL 6.25 MG PO TABS
6.2500 mg | ORAL_TABLET | Freq: Two times a day (BID) | ORAL | Status: DC
  Administered 2016-10-31 – 2016-11-01 (×2): 6.25 mg via ORAL
  Filled 2016-10-31 (×2): qty 1

## 2016-10-31 MED ORDER — MAGNESIUM SULFATE 2 GM/50ML IV SOLN
2.0000 g | Freq: Once | INTRAVENOUS | Status: DC
  Administered 2016-10-31: 2 g via INTRAVENOUS
  Filled 2016-10-31: qty 50

## 2016-10-31 MED ORDER — POTASSIUM CHLORIDE CRYS ER 20 MEQ PO TBCR
20.0000 meq | EXTENDED_RELEASE_TABLET | Freq: Once | ORAL | Status: AC
  Administered 2016-10-31: 20 meq via ORAL
  Filled 2016-10-31: qty 1

## 2016-10-31 MED ORDER — PNEUMOCOCCAL VAC POLYVALENT 25 MCG/0.5ML IJ INJ
0.5000 mL | INJECTION | INTRAMUSCULAR | Status: DC
  Filled 2016-10-31: qty 0.5

## 2016-10-31 MED ORDER — CARVEDILOL 3.125 MG PO TABS
3.1250 mg | ORAL_TABLET | Freq: Once | ORAL | Status: AC
  Administered 2016-10-31: 3.125 mg via ORAL
  Filled 2016-10-31: qty 1

## 2016-10-31 MED ORDER — ENALAPRIL MALEATE 2.5 MG PO TABS
2.5000 mg | ORAL_TABLET | Freq: Every day | ORAL | Status: DC
  Administered 2016-10-31 – 2016-11-01 (×2): 2.5 mg via ORAL
  Filled 2016-10-31 (×2): qty 1

## 2016-10-31 NOTE — Progress Notes (Signed)
Notified Dr. Allena KatzPatel and Eula Listenyan Dunn that patient had a 60 bt run of Vtach suggested that we get magnesium on patient. Orders received for 3.125 of carvedilol and 2 gm IV magnesium.

## 2016-10-31 NOTE — Progress Notes (Signed)
Progress Note  Patient Name: [name] Date of Encounter: 10/31/2016  Primary Cardiologist: New to [facility] - consult by Arida  Subjective   No further chest pain. Mild SOB. Troponin peaked at 2.86, now down trending. Echo pending. Tolerating medications. BP stable.   Inpatient Medications    Scheduled Meds: . aspirin  81 mg Oral Daily  . atorvastatin  40 mg Oral Daily  . benztropine  0.5 mg Oral BID  . budesonide  1 mg Nebulization BID  . carvedilol  3.125 mg Oral BID WC  . enoxaparin (LOVENOX) injection  40 mg Subcutaneous Q24H  . gabapentin  400 mg Oral TID  . haloperidol  5 mg Oral BID  . ipratropium-albuterol  3 mL Nebulization Q6H  . LORazepam  1 mg Oral BID  . OLANZapine  15 mg Oral BID  . pantoprazole  40 mg Oral Daily  . [START ON 11/01/2016] pneumococcal 23 valent vaccine  0.5 mL Intramuscular Tomorrow-1000  . sodium chloride flush  3 mL Intravenous Q12H  . ticagrelor  90 mg Oral BID   Continuous Infusions: . sodium chloride     PRN Meds: sodium chloride, albuterol, HYDROcodone-acetaminophen, hydrOXYzine, ondansetron (ZOFRAN) IV, oxyCODONE-acetaminophen, sodium chloride flush   Vital Signs    Vitals:   10/31/16 0000 10/31/16 0100 10/31/16 0200 10/31/16 0400  BP: (!) 125/94 114/84 115/79 115/82  Pulse: 69 70 77 64  Resp: 15 14 14 15   Temp: 98.3 F (36.8 C)   (!) 97.1 F (36.2 C)  TempSrc: Oral   Axillary  SpO2: 99% 100% 97% 99%  Weight:      Height:        Intake/Output Summary (Last 24 hours) at 10/31/16 0725 Last data filed at 10/31/16 0400  Gross per 24 hour  Intake            73.75 ml  Output             1825 ml  Net         -1751.25 ml   Filed Weights   10/30/16 1408 10/30/16 1623  Weight: 147 lb (66.7 kg) 151 lb 7.3 oz (68.7 kg)    Telemetry    NSR - Personally Reviewed  ECG    n/a - Personally Reviewed  Physical Exam   GEN: No acute distress.   Neck: No JVD Cardiac: RRR, no murmurs, rubs, or gallops. Left radial cath site  without bleeding, bruising, swelling, erythema or TTP. Radial pulse 2+. Respiratory: Clear to auscultation bilaterally. GI: Soft, nontender, non-distended  MS: No edema; No deformity. Neuro:  Nonfocal  Psych: Normal affect   Labs    Chemistry Recent Labs Lab 10/30/16 1408 10/30/16 1629 10/31/16 0411  NA 134*  --  140  K 3.5  --  3.9  CL 104  --  109  CO2 28  --  26  GLUCOSE 125*  --  93  BUN 7  --  5*  CREATININE 1.04 0.91 0.83  CALCIUM 9.4  --  8.6*  PROT 8.2*  --   --   ALBUMIN 3.8  --   --   AST 25  --   --   ALT 21  --   --   ALKPHOS 150*  --   --   BILITOT 0.5  --   --   GFRNONAA >60 >60 >60  GFRAA >60 >60 >60  ANIONGAP 2*  --  5     Hematology Recent Labs Lab 10/30/16 1408 10/30/16  1629 10/31/16 0411  WBC 14.3* 18.4* 12.3*  RBC 5.58 4.92 4.90  HGB 16.8 15.0 14.8  HCT 49.8 43.3 43.5  MCV 89.3 88.0 88.6  MCH 30.1 30.4 30.2  MCHC 33.7 34.5 34.1  RDW 14.0 14.1 14.1  PLT 300 225 209    Cardiac Enzymes Recent Labs Lab 10/30/16 1408 10/30/16 1629 10/30/16 2136 10/31/16 0411  TROPONINI 0.04* 1.51* 2.86* 1.76*   No results for input(s): TROPIPOC in the last 168 hours.   BNPNo results for input(s): BNP, PROBNP in the last 168 hours.   DDimer No results for input(s): DDIMER in the last 168 hours.   Radiology    Dg Chest Port 1 View  Result Date: 10/30/2016 IMPRESSION: No active disease. Electronically Signed   By: Myles RosenthalJohn  Stahl M.D.   On: 10/30/2016 14:30    Cardiac Studies   LHC 10/30/2016: Coronary Findings   Dominance: Left  Left Main  Vessel is angiographically normal.  Left Anterior Descending  Mid LAD lesion, 95% stenosed. The lesion is type C and thrombotic.  Angioplasty: Lesion crossed with guidewire. Pre-stent angioplasty was performed. A drug eluting stent was successfully placed. Post-stent angioplasty was performed. The pre-interventional distal flow is normal (TIMI 3). The post-interventional distal flow is normal (TIMI 3). The  intervention was successful . No complications occurred at this lesion.  There is no residual stenosis post intervention.  First Diagonal Branch  Vessel is small in size. Vessel is angiographically normal.  Second Diagonal Branch  Vessel is angiographically normal.  Third Diagonal Branch  Vessel is small in size. Vessel is angiographically normal.  Left Circumflex  The vessel exhibits minimal luminal irregularities.  First Obtuse Marginal Branch  The vessel exhibits minimal luminal irregularities.  Second Obtuse Marginal Branch  The vessel exhibits minimal luminal irregularities.  Third Obtuse Marginal Branch  Vessel is angiographically normal.  Fourth Obtuse Marginal Branch  The vessel exhibits minimal luminal irregularities.  Left Posterior Descending Artery  The vessel exhibits minimal luminal irregularities.  Right Coronary Artery  The vessel exhibits minimal luminal irregularities.  Wall Motion              Left Heart   Left Ventricle The left ventricular size is normal. There is mild to moderate left ventricular systolic dysfunction. LV end diastolic pressure is mildly elevated. The left ventricular ejection fraction is 35-45% by visual estimate. There are LV function abnormalities due to segmental dysfunction.    Coronary Diagrams   Diagnostic Diagram       Post-Intervention Diagram          TTE pending.  Patient Profile     46 y.o. male schizophrenia, HLD, and tobacco who was admitted 10/21 with anterior ST elevation MI.   Assessment & Plan    1. Anterior ST elevation MI: -Currently, without chest pain -Status post PCI/DES to the mid LAD as above -Troponin peaked at 2.86, now down trending -Echo pending, EF 35-40% by LV gram with severe anterior, apical and distal inferior hypokinesis -Continue uninterrupted DAPT with ASA 81 mg daily and Brilinta 90 mg bid for at least 12 months -Coreg -Transfer to 2A today -PT -Cardiac rehab  2. HLD: -Lipitor  40 mg daily -LDL 77, plan for outpatient lipid and liver function in 8 weeks  3. Tobacco abuse: -Cessation advised   4. Schizophrenia: -Lives in a group home -Per IM  For questions or updates, please contact [facility] Please consult [email or web address] for contact info under Cardiology/STEMI.  Signed, Eula Listen, PA-C  10/31/2016, 7:25 AM

## 2016-10-31 NOTE — Progress Notes (Signed)
Called by RN pt had long run to [facility],Non sustained K ok. Check mag stat--replace if needed Advised RN to inform cardiology
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
## 2016-10-31 NOTE — Progress Notes (Signed)
*  PRELIMINARY RESULTS* Echocardiogram 2D Echocardiogram has been performed.  Cristela BlueHege, Enslie Sahota 10/31/2016, 9:12 AM

## 2016-10-31 NOTE — Progress Notes (Signed)
PULMONARY / CRITICAL CARE MEDICINE   Name: [name] MRN: [id number] DOB: 09/22/1970    ADMISSION DATE:  10/30/2016   CONSULTATION DATE: 10/30/2016  REFERRING MD: Dr. Kirke CorinArida  Reason: ICU management status post left heart catheterization/PCI  CHIEF COMPLAINT: Chest pain  HISTORY OF PRESENT ILLNESS:   This is a 46 year old Caucasian male with a past medical history as indicated below, current smoker, who presented to the ED with complaints of chest pain that started suddenly.  He ruled in for non-ST elevation MI and was taken to the Cath Lab for an emergent left heart catheterization and PCI and stent placement in the LAD.  He is now status post left heart catheterization and is being admitted to the ICU for further management.  He is pleasant and offers no complaints.  He reports complete resolution of chest pain   SUBJECTIVE: No acute issues overnight.  Right wrist PCI site intact without bleeding.  Denies chest pain, shortness of breath, shortness of breath with exertion, nausea and vomiting  VITAL SIGNS: BP 115/82 (BP Location: Right Arm)   Pulse 64   Temp (!) 97.1 F (36.2 C) (Axillary)   Resp 15   Ht 5\' 8"  (1.727 m)   Wt 151 lb 7.3 oz (68.7 kg)   SpO2 99%   BMI 23.03 kg/m   HEMODYNAMICS:    VENTILATOR SETTINGS:    INTAKE / OUTPUT: I/O last 3 completed shifts: In: 73.8 [I.V.:73.8] Out: 525 [Urine:525]  PHYSICAL EXAMINATION: General: Pleasant, no acute distress Neuro: Alert and oriented x4 no focal deficits HEENT: PERRLA, trachea midline, no JVD, neck is supple Cardiovascular: rrr, S1-S2, no murmur to go to gallop, +2 pulses, no edema Lungs: Bilateral breath sounds, diminished in the bases, mild expiratory wheezes Abdomen: Nondistended, normal bowel sounds in all 4 quadrants Musculoskeletal: Positive range of motion, no deformities Skin: Warm dry without cyanosis  LABS:  BMET  Recent Labs Lab 10/30/16 1408 10/30/16 1629 10/31/16 0411  NA 134*  --  140   K 3.5  --  3.9  CL 104  --  109  CO2 28  --  26  BUN 7  --  5*  CREATININE 1.04 0.91 0.83  GLUCOSE 125*  --  93    Electrolytes  Recent Labs Lab 10/30/16 1408 10/31/16 0411  CALCIUM 9.4 8.6*    CBC  Recent Labs Lab 10/30/16 1408 10/30/16 1629 10/31/16 0411  WBC 14.3* 18.4* 12.3*  HGB 16.8 15.0 14.8  HCT 49.8 43.3 43.5  PLT 300 225 209    Coag's  Recent Labs Lab 10/30/16 1408  APTT 30  INR 1.02    Sepsis Markers No results for input(s): LATICACIDVEN, PROCALCITON, O2SATVEN in the last 168 hours.  ABG No results for input(s): PHART, PCO2ART, PO2ART in the last 168 hours.  Liver Enzymes  Recent Labs Lab 10/30/16 1408  AST 25  ALT 21  ALKPHOS 150*  BILITOT 0.5  ALBUMIN 3.8    Cardiac Enzymes  Recent Labs Lab 10/30/16 1629 10/30/16 2136 10/31/16 0411  TROPONINI 1.51* 2.86* 1.76*    Glucose No results for input(s): GLUCAP in the last 168 hours.  Imaging Dg Chest Port 1 View  Result Date: 10/30/2016 CLINICAL DATA:  Acute onset left-sided chest pain, shortness breath, and diaphoresis. EXAM: PORTABLE CHEST 1 VIEW COMPARISON:  None. FINDINGS: The heart size and mediastinal contours are within normal limits. Both lungs are clear. The visualized skeletal structures are unremarkable. IMPRESSION: No active disease. Electronically Signed   By: Jonny RuizJohn  Eppie Gibson M.D.   On: 10/30/2016 14:30   STUDIES:  LEFT HEART CATH AND CORONARY ANGIOGRAPHY  Conclusion     A drug eluting stent was successfully placed.  Mid LAD lesion, 95 %stenosed.  Post intervention, there is a 0% residual stenosis.  There is mild to moderate left ventricular systolic dysfunction.  LV end diastolic pressure is mildly elevated.  The left ventricular ejection fraction is 35-45% by visual estimate.   1. Severe one-vessel coronary artery disease with thrombotic subtotal occlusion of the mid LAD. No other obstructive disease. 2. Moderately reduced LV systolic function with an EF  of 35-40% with severe hypokinesis of the mid to distal anterior, apical and distal inferior walls. Mildly elevated left ventricular end-diastolic pressure. 3. Successful angioplasty and drug-eluting stent placement to the mid LAD.  Recommendations: Dual antiplatelet therapy for at least one year. Aggressive treatment of risk factors. Smoking cessation in cardiac rehabilitation is recommended.   2D echo pending   CULTURES: None  ANTIBIOTICS: None  SIGNIFICANT EVENTS: 10/30/2016: Admitted with ST elevation MI  LINES/TUBES: Peripheral IVs  DISCUSSION: 46 year old male, current smoker admitted with an acute anterior wall ST elevation MI, now status post PCI with stent to the mid LAD  ASSESSMENT  Acute ST elevation MI status post PCI with stent placement Tobacco use disorder History of schizophrenia Hyperlipidemia  PLAN Hemodynamic monitoring per ICU protocol Continue antiplatelet therapy per cardiology Continue statin and beta-blocker therapy Smoking cessation advice given, patient declined nicotine patch Continue all home medications for schizophrenia and hyperlipidemia Trend cardiac enzymes Follow-up 2D echo, chest x-ray and EKG post PCI GI and DVT prophylaxis  Further changes in treatment plan pending clinical course and diagnostics  FAMILY  - Updates: Patient updated on current treatment plan.  Patient is full code  - Inter-disciplinary family meet or Palliative Care meeting due by:  day 7   Magdalene S. [facility] ANP-BC Pulmonary and Critical Care Medicine [facility] Pager [phone] or [phone] 10/31/2016, 6:11 AM

## 2016-10-31 NOTE — Consult Note (Signed)
PULMONARY / CRITICAL CARE MEDICINE   Name: [name] MRN: [id number] DOB: 1970/03/16    ADMISSION DATE:  10/30/2016   CONSULTATION DATE: 10/30/2016  REFERRING MD: Dr. Kirke Corin  Reason: ICU management status post left heart catheterization/PCI  CHIEF COMPLAINT: Chest pain  HISTORY OF PRESENT ILLNESS:   This is a 46 year old Caucasian male with a past medical history as indicated below, current smoker, who presented to the ED with complaints of chest pain that started suddenly.  He ruled in for non-ST elevation MI and was taken to the Cath Lab for an emergent left heart catheterization and PCI and stent placement in the LAD.  He is now status post left heart catheterization and is being admitted to the ICU for further management.  He is pleasant and offers no complaints.  He reports complete resolution of chest pain  PAST MEDICAL HISTORY :  He  has a past medical history of Hypercholesteremia and Schizophrenia (HCC).  PAST SURGICAL HISTORY: He  has a past surgical history that includes Cholecystectomy (N/A, 09/20/2014); Endoscopic retrograde cholangiopancreatography (ercp) with propofol (N/A, 09/22/2014); and ERCP (N/A, 09/25/2014).  No Known Allergies  No current facility-administered medications on file prior to encounter.    Current Outpatient Prescriptions on File Prior to Encounter  Medication Sig  . albuterol (PROVENTIL HFA;VENTOLIN HFA) 108 (90 BASE) MCG/ACT inhaler Inhale 2 puffs into the lungs every 4 (four) hours as needed for wheezing or shortness of breath.  Marland Kitchen amoxicillin (AMOXIL) 500 MG capsule Take 1 capsule (500 mg total) by mouth 3 (three) times daily.  Marland Kitchen atorvastatin (LIPITOR) 40 MG tablet Take 40 mg by mouth daily.  . benztropine (COGENTIN) 0.5 MG tablet Take 0.5 mg by mouth 2 (two) times daily.  Marland Kitchen esomeprazole (NEXIUM) 40 MG capsule Take 40 mg by mouth daily.   . fluticasone (FLOVENT HFA) 110 MCG/ACT inhaler Inhale 2 puffs into the lungs 2 (two) times daily.  Marland Kitchen  gabapentin (NEURONTIN) 400 MG capsule Take 400 mg by mouth 3 (three) times daily.  . haloperidol (HALDOL) 5 MG tablet Take 5 mg by mouth 2 (two) times daily.  Marland Kitchen HYDROcodone-acetaminophen (NORCO/VICODIN) 5-325 MG per tablet Take 1-2 tablets by mouth every 4 (four) hours as needed for moderate pain.  . hydrOXYzine (VISTARIL) 50 MG capsule Take 50 mg by mouth 4 (four) times daily as needed for anxiety (agitation. Limit 4 caps per 24 hours.).  Marland Kitchen ibuprofen (ADVIL,MOTRIN) 600 MG tablet Take 1 tablet (600 mg total) by mouth every 8 (eight) hours as needed.  Marland Kitchen LORazepam (ATIVAN) 1 MG tablet Take 1 mg by mouth 2 (two) times daily.   Marland Kitchen OLANZapine (ZYPREXA) 15 MG tablet Take 15 mg by mouth 2 (two) times daily.  Marland Kitchen oxyCODONE-acetaminophen (ROXICET) 5-325 MG tablet Take 1 tablet by mouth every 6 (six) hours as needed for moderate pain.  . ranitidine (ZANTAC) 150 MG capsule Take 150 mg by mouth 2 (two) times daily.    FAMILY HISTORY:  His indicated that his mother is deceased. He indicated that his father is alive.    SOCIAL HISTORY: He  reports that he has been smoking Cigarettes.  He has a 30.00 pack-year smoking history. He has never used smokeless tobacco. He reports that he does not drink alcohol or use drugs.  REVIEW OF SYSTEMS:   Constitutional: Negative for fever and chills.  HENT: Negative for congestion and rhinorrhea.  Eyes: Negative for redness and visual disturbance.  Respiratory: Negative for shortness of breath and wheezing.  Cardiovascular: Negative for  chest pain and palpitations.  Gastrointestinal: Negative  for nausea , vomiting and abdominal pain and  Loose stools Genitourinary: Negative for dysuria and urgency.  Endocrine: Denies polyuria, polyphagia and heat intolerance Musculoskeletal: Negative for myalgias and arthralgias.  Skin: Negative for pallor and wound.  Neurological: Negative for dizziness and headaches   SUBJECTIVE:   VITAL SIGNS: BP 115/79 (BP Location: Right  Arm)   Pulse 77   Temp 98.3 F (36.8 C) (Oral)   Resp 14   Ht 5\' 8"  (1.727 m)   Wt 151 lb 7.3 oz (68.7 kg)   SpO2 97%   BMI 23.03 kg/m   HEMODYNAMICS:    VENTILATOR SETTINGS:    INTAKE / OUTPUT: I/O last 3 completed shifts: In: 73.8 [I.V.:73.8] Out: 525 [Urine:525]  PHYSICAL EXAMINATION: General: Pleasant, no acute distress Neuro: Alert and oriented x4 no focal deficits HEENT: PERRLA, trachea midline, no JVD, neck is supple Cardiovascular: rrr, S1-S2, no murmur to go to gallop, +2 pulses, no edema Lungs: Bilateral breath sounds, diminished in the bases, mild expiratory wheezes Abdomen: Nondistended, normal bowel sounds in all 4 quadrants Musculoskeletal: Positive range of motion, no deformities Skin: Warm dry without cyanosis  LABS:  BMET  Recent Labs Lab 10/30/16 1408 10/30/16 1629  NA 134*  --   K 3.5  --   CL 104  --   CO2 28  --   BUN 7  --   CREATININE 1.04 0.91  GLUCOSE 125*  --     Electrolytes  Recent Labs Lab 10/30/16 1408  CALCIUM 9.4    CBC  Recent Labs Lab 10/30/16 1408 10/30/16 1629  WBC 14.3* 18.4*  HGB 16.8 15.0  HCT 49.8 43.3  PLT 300 225    Coag's  Recent Labs Lab 10/30/16 1408  APTT 30  INR 1.02    Sepsis Markers No results for input(s): LATICACIDVEN, PROCALCITON, O2SATVEN in the last 168 hours.  ABG No results for input(s): PHART, PCO2ART, PO2ART in the last 168 hours.  Liver Enzymes  Recent Labs Lab 10/30/16 1408  AST 25  ALT 21  ALKPHOS 150*  BILITOT 0.5  ALBUMIN 3.8    Cardiac Enzymes  Recent Labs Lab 10/30/16 1408 10/30/16 1629 10/30/16 2136  TROPONINI 0.04* 1.51* 2.86*    Glucose No results for input(s): GLUCAP in the last 168 hours.  Imaging Dg Chest Port 1 View  Result Date: 10/30/2016 CLINICAL DATA:  Acute onset left-sided chest pain, shortness breath, and diaphoresis. EXAM: PORTABLE CHEST 1 VIEW COMPARISON:  None. FINDINGS: The heart size and mediastinal contours are within  normal limits. Both lungs are clear. The visualized skeletal structures are unremarkable. IMPRESSION: No active disease. Electronically Signed   By: Myles RosenthalJohn  Stahl M.D.   On: 10/30/2016 14:30   STUDIES:  LEFT HEART CATH AND CORONARY ANGIOGRAPHY  Conclusion     A drug eluting stent was successfully placed.  Mid LAD lesion, 95 %stenosed.  Post intervention, there is a 0% residual stenosis.  There is mild to moderate left ventricular systolic dysfunction.  LV end diastolic pressure is mildly elevated.  The left ventricular ejection fraction is 35-45% by visual estimate.   1. Severe one-vessel coronary artery disease with thrombotic subtotal occlusion of the mid LAD. No other obstructive disease. 2. Moderately reduced LV systolic function with an EF of 35-40% with severe hypokinesis of the mid to distal anterior, apical and distal inferior walls. Mildly elevated left ventricular end-diastolic pressure. 3. Successful angioplasty and drug-eluting stent placement to the mid  LAD.  Recommendations: Dual antiplatelet therapy for at least one year. Aggressive treatment of risk factors. Smoking cessation in cardiac rehabilitation is recommended.   2D echo pending   CULTURES: None  ANTIBIOTICS: None  SIGNIFICANT EVENTS: 10/30/2016: Admitted with ST elevation MI  LINES/TUBES: Peripheral IVs  DISCUSSION: 46 year old male, current smoker admitted with an acute anterior wall ST elevation MI, now status post PCI with stent to the mid LAD  ASSESSMENT  Acute ST elevation MI status post PCI with stent placement Tobacco use disorder History of schizophrenia Hyperlipidemia  PLAN Hemodynamic monitoring per ICU protocol Antiplatelet therapy per cardiology Statin and beta-blocker therapy Smoking cessation advice given, patient declined nicotine patch Resume all home medications for schizophrenia and hyperlipidemia Trend cardiac enzymes Follow-up 2D echo, chest x-ray and EKG post PCI GI  and DVT prophylaxis  Further changes in treatment plan pending clinical course and diagnostics  FAMILY  - Updates: Patient updated on current treatment plan.  Patient is full code  - Inter-disciplinary family meet or Palliative Care meeting due by:  day 7   Magdalene S. [facility] ANP-BC Pulmonary and Critical Care Medicine [facility] Pager [phone] or [phone] 10/31/2016, 3:53 AM

## 2016-10-31 NOTE — Progress Notes (Signed)
   Notified patient had sustained monomorphic VT at 14:00-14:01 at a rate of ~ 200 bpm. No LOC. Patient reported "feeling funny." No associated chest pain, SOB, diaphoresis, or syncope. Currently asymptomatic. Stat EKG showed NSR, 80 bpm, anterolateral TWI c/w evolving MI, QTc 468 msec. Given stat Coreg 3.125 mg x 1. Will increase PM dose of Coreg to 6.25 mg bid. Stat magnesium checked and noted to be 2.0. IV magnesium, 2 gram, currently being given. Potassium 3.9, will replete to goal > 4.0. Check TSH. Await formal read on echocardiogram. Dicussed with IM, consult placed for psychiatry to review medications given QTc and VT. Discontinued Zofran for now. Order placed for Life Vest per Dr. Kirke CorinArida, MD. [facility] Case manager notified. Case, including EKG, reviewed with Dr. Kirke CorinArida, MD who agrees with the above assessment and plan.

## 2016-10-31 NOTE — Progress Notes (Signed)
Pt arrived to the unit in stable condition on room air, VSS, no complaints of pain at this time, pt oriented to room and unit and verbalized understanding, all safety measures in place, will continue to monitor.

## 2016-10-31 NOTE — Progress Notes (Signed)
[facility] [place] [facility]   PATIENT NAME: [name]    MR#:  [id number]  DATE OF BIRTH:  12/30/1970  SUBJECTIVE:  Denies any chest pain. Feels good. Ate breakfast.  REVIEW OF SYSTEMS:   Review of Systems  Constitutional: Negative for chills, fever and weight loss.  HENT: Negative for ear discharge, ear pain and nosebleeds.   Eyes: Negative for blurred vision, pain and discharge.  Respiratory: Negative for sputum production, shortness of breath, wheezing and stridor.   Cardiovascular: Negative for chest pain, palpitations, orthopnea and PND.  Gastrointestinal: Negative for abdominal pain, diarrhea, nausea and vomiting.  Genitourinary: Negative for frequency and urgency.  Musculoskeletal: Negative for back pain and joint pain.  Neurological: Negative for sensory change, speech change, focal weakness and weakness.  Psychiatric/Behavioral: Negative for depression and hallucinations. The patient is not nervous/anxious.    Tolerating Diet:yes Tolerating PT: Ambulatory  DRUG ALLERGIES:  No Known Allergies  VITALS:  Blood pressure 113/83, pulse 87, temperature 97.7 F (36.5 C), temperature source Oral, resp. rate 18, height 5\' 8"  (1.727 m), weight 68.7 kg (151 lb 7.3 oz), SpO2 97 %.  PHYSICAL EXAMINATION:   Physical Exam  GENERAL:  46 y.o.-year-old patient lying in the bed with no acute distress.  EYES: Pupils equal, round, reactive to light and accommodation. No scleral icterus. Extraocular muscles intact.  HEENT: Head atraumatic, normocephalic. Oropharynx and nasopharynx clear.  NECK:  Supple, no jugular venous distention. No thyroid enlargement, no tenderness.  LUNGS: Normal breath sounds bilaterally, no wheezing, rales, rhonchi. No use of accessory muscles of respiration.  CARDIOVASCULAR: S1, S2 normal. No murmurs, rubs, or gallops.  ABDOMEN: Soft, nontender, nondistended. Bowel sounds present. No organomegaly or mass.  EXTREMITIES: No  cyanosis, clubbing or edema b/l.    NEUROLOGIC: Cranial nerves II through XII are intact. No focal Motor or sensory deficits b/l.   PSYCHIATRIC:  patient is alert and oriented x 3.  SKIN: No obvious rash, lesion, or ulcer.   LABORATORY PANEL:  CBC  Recent Labs Lab 10/31/16 0411  WBC 12.3*  HGB 14.8  HCT 43.5  PLT 209    Chemistries   Recent Labs Lab 10/30/16 1408  10/31/16 0411  NA 134*  --  140  K 3.5  --  3.9  CL 104  --  109  CO2 28  --  26  GLUCOSE 125*  --  93  BUN 7  --  5*  CREATININE 1.04  < > 0.83  CALCIUM 9.4  --  8.6*  AST 25  --   --   ALT 21  --   --   ALKPHOS 150*  --   --   BILITOT 0.5  --   --   < > = values in this interval not displayed. Cardiac Enzymes  Recent Labs Lab 10/31/16 0411  TROPONINI 1.76*   RADIOLOGY:  Dg Chest Port 1 View  Result Date: 10/30/2016 CLINICAL DATA:  Acute onset left-sided chest pain, shortness breath, and diaphoresis. EXAM: PORTABLE CHEST 1 VIEW COMPARISON:  None. FINDINGS: The heart size and mediastinal contours are within normal limits. Both lungs are clear. The visualized skeletal structures are unremarkable. IMPRESSION: No active disease. Electronically Signed   By: Myles RosenthalJohn  Stahl M.D.   On: 10/30/2016 14:30   ASSESSMENT AND PLAN:  46 year old male with past medical history of hyperlipidemia, schizophrenia who presented to the hospital with chest pain and noted to have ST elevations in the anterior leads.  1.  ACUTE ST elevation MI-this is a cause of patient's chest pain. Patient underwent emergent cardiac catheterization which showed a mid LAD lesion which was stented. -Patient's chronic chest pain-free and hemodynamically stable. -Continue aspirin, Brillinta, carvedilol, atorvastatin. -Appreciate cardiology help.  2. History of schizophrenia-continue Zyprexa, Haldol.  3. Anxiety-continue Ativan.  4. Neuropathy-continue gabapentin.  5. GERD-continue Protonix.  Overall doing well. Transfer to the  floor.  Case discussed with Care Management/Social Worker. Management plans discussed with the patient, family and they are in agreement.  CODE STATUS: full  DVT Prophylaxis: lovenox  TOTAL TIME TAKING CARE OF THIS PATIENT: *30* minutes.  >50% time spent on counselling and coordination of care  POSSIBLE D/C IN 1-2 DAYS, DEPENDING ON CLINICAL CONDITION.  Note: This dictation was prepared with Dragon dictation along with smaller phrase technology. Any transcriptional errors that result from this process are unintentional.  Ebony Yorio M.D on 10/31/2016 at 12:41 PM  Between 7am to 6pm - Pager - [phone]  After 6pm go to [email or web address] - Social research officer, government  [facility]  Office  [phone]  CC: Primary care physician; Center, [facility]

## 2016-11-01 ENCOUNTER — Telehealth: Payer: Self-pay | Admitting: Cardiovascular Disease

## 2016-11-01 DIAGNOSIS — I255 Ischemic cardiomyopathy: Secondary | ICD-10-CM

## 2016-11-01 DIAGNOSIS — F209 Schizophrenia, unspecified: Secondary | ICD-10-CM

## 2016-11-01 DIAGNOSIS — I472 Ventricular tachycardia: Secondary | ICD-10-CM

## 2016-11-01 LAB — BASIC METABOLIC PANEL
ANION GAP: 4 — AB (ref 5–15)
BUN: 11 mg/dL (ref 6–20)
CHLORIDE: 109 mmol/L (ref 101–111)
CO2: 26 mmol/L (ref 22–32)
Calcium: 8.5 mg/dL — ABNORMAL LOW (ref 8.9–10.3)
Creatinine, Ser: 0.94 mg/dL (ref 0.61–1.24)
GFR calc Af Amer: >60 mL/min (ref >60)
Glucose, Bld: 91 mg/dL (ref 65–99)
POTASSIUM: 3.9 mmol/L (ref 3.5–5.1)
SODIUM: 139 mmol/L (ref 135–145)

## 2016-11-01 LAB — CBC
HEMATOCRIT: 43.7 % (ref 40.0–52.0)
HEMOGLOBIN: 14.7 g/dL (ref 13.0–18.0)
MCH: 30 pg (ref 26.0–34.0)
MCHC: 33.6 g/dL (ref 32.0–36.0)
MCV: 89.1 fL (ref 80.0–100.0)
Platelets: 196 10*3/uL (ref 150–440)
RBC: 4.91 MIL/uL (ref 4.40–5.90)
RDW: 14 % (ref 11.5–14.5)
WBC: 13.6 10*3/uL — ABNORMAL HIGH (ref 3.8–10.6)

## 2016-11-01 LAB — MAGNESIUM: MAGNESIUM: 2.3 mg/dL (ref 1.7–2.4)

## 2016-11-01 MED ORDER — ASPIRIN 81 MG PO CHEW: 81.0000 mg | CHEWABLE_TABLET | Freq: Every day | ORAL | 0 refills | Status: DC

## 2016-11-01 MED ORDER — ENALAPRIL MALEATE 2.5 MG PO TABS: 2.5000 mg | ORAL_TABLET | Freq: Every day | ORAL | 0 refills | Status: DC

## 2016-11-01 MED ORDER — CARVEDILOL 6.25 MG PO TABS: 6.2500 mg | ORAL_TABLET | Freq: Two times a day (BID) | ORAL | 0 refills | Status: DC

## 2016-11-01 MED ORDER — TICAGRELOR 90 MG PO TABS
90.0000 mg | ORAL_TABLET | Freq: Two times a day (BID) | ORAL | 1 refills | Status: DC
Start: 2016-11-01 — End: 2016-11-29

## 2016-11-01 NOTE — Clinical Social Work Note (Signed)
CSW spoke with patient's caregiver [name], and he will pick up patient between 1 and 1:30 this afternoon.  CSW provided copy of FL2, and discharge summary in packet for patient to return back to the family care home that he is staying at.  Patient will be discharging back to group home today.  [name], MSW, Theresia MajorsLCSWA [phone]  11/01/2016 12:18 PM

## 2016-11-01 NOTE — Care Management Important Message (Signed)
Important Message  Patient Details  Name: [name] MRN: [id number] Date of Birth: 09/29/1970   Medicare Important Message Given:  N/A - LOS <3 / Initial given by admissions    Eber HongGreene,  Carmack R, RN 11/01/2016, 10:07 AM

## 2016-11-01 NOTE — Consult Note (Signed)
ELECTROPHYSIOLOGY CONSULT NOTE  Patient ID: [name], MRN: [id number], DOB/AGE: 1970/06/09 46 y.o. Admit date: 10/30/2016 Date of Consult: 11/01/2016  Primary Physician: Center, [facility] Primary Cardiologist: [name] is a 46 y.o. male who is being seen today for the evaluation of VT at the request of [name].   Chief Complaint: ventricular tachycardia   HPI [name] is a 46 y.o. male with hx of schizophrenia Rx with zyprexa Seen for Ventricular tachycardia   Admitted 10/21 for STEMI with Anterior wall involvement;  CATH>:> LAD 95% --Stent .  EF 35-45%.  Peak troponin was only to 2.86Now rx with carvedilol, ACE, DAPT  Yesterday morning he had 30+ seconds of sustained ventricular tachycardia. It was associated with palpitations. He recognized it as having had it previously.  He has a history of dyspnea on exertion at less than one flight of stairs and probably 200 yards. He's had exertional chest discomfort over recent months relieved by rest. He has not had orthopnea or nocturnal dyspnea. He has remote syncope associated with drinking. He smokes.     Past Medical History:  Diagnosis Date  . Hypercholesteremia   . Schizophrenia [facility])       Surgical History:  Past Surgical History:  Procedure Laterality Date  . CHOLECYSTECTOMY N/A 09/20/2014   Procedure: LAPAROSCOPIC CHOLECYSTECTOMY;  Surgeon: Natale Lay, MD;  Location: [facility] ORS;  Service: General;  Laterality: N/A;  . CORONARY/GRAFT ACUTE MI REVASCULARIZATION N/A 10/30/2016   Procedure: Coronary/Graft Acute MI Revascularization;  Surgeon: Iran Ouch, MD;  Location: [facility] INVASIVE CV LAB;  Service: Cardiovascular;  Laterality: N/A;  . ENDOSCOPIC RETROGRADE CHOLANGIOPANCREATOGRAPHY (ERCP) WITH PROPOFOL N/A 09/22/2014   Procedure: ENDOSCOPIC RETROGRADE CHOLANGIOPANCREATOGRAPHY (ERCP) WITH PROPOFOL;  Surgeon: Wallace Cullens, MD;  Location: [facility] ENDOSCOPY;  Service: Endoscopy;  Laterality: N/A;  . ERCP N/A  09/25/2014   Procedure: ENDOSCOPIC RETROGRADE CHOLANGIOPANCREATOGRAPHY (ERCP);  Surgeon: Midge Minium, MD;  Location: [facility] ENDOSCOPY;  Service: Endoscopy;  Laterality: N/A;  . LEFT HEART CATH AND CORONARY ANGIOGRAPHY N/A 10/30/2016   Procedure: LEFT HEART CATH AND CORONARY ANGIOGRAPHY;  Surgeon: Iran Ouch, MD;  Location: [facility] INVASIVE CV LAB;  Service: Cardiovascular;  Laterality: N/A;     Home Meds: Prior to Admission medications   Medication Sig Start Date End Date Taking? Authorizing Provider  albuterol (PROVENTIL HFA;VENTOLIN HFA) 108 (90 BASE) MCG/ACT inhaler Inhale 2 puffs into the lungs every 4 (four) hours as needed for wheezing or shortness of breath.   Yes [provider]  atorvastatin (LIPITOR) 40 MG tablet Take 40 mg by mouth every evening.    Yes [provider]  benztropine (COGENTIN) 0.5 MG tablet Take 0.5 mg by mouth 2 (two) times daily.   Yes [provider]  esomeprazole (NEXIUM) 40 MG capsule Take 40 mg by mouth 2 (two) times daily before a meal.    Yes [provider]  fluticasone (FLONASE) 50 MCG/ACT nasal spray Place 2 sprays into both nostrils 2 (two) times daily.   Yes [provider]  fluticasone (FLOVENT HFA) 110 MCG/ACT inhaler Inhale 2 puffs into the lungs 2 (two) times daily.   Yes [provider]  gabapentin (NEURONTIN) 400 MG capsule Take 400 mg by mouth 3 (three) times daily.   Yes [provider]  haloperidol (HALDOL) 5 MG tablet Take 5 mg by mouth 2 (two) times daily.   Yes [provider]  hydrOXYzine (VISTARIL) 50 MG capsule Take 50 mg by mouth 4 (four) times  daily as needed for anxiety (agitation. Limit 4 caps per 24 hours.).   Yes [provider]  ibuprofen (ADVIL,MOTRIN) 600 MG tablet Take 1 tablet (600 mg total) by mouth every 8 (eight) hours as needed. 08/10/15  Yes Joni ReiningSmith, Ronald K, PA-C  LORazepam (ATIVAN) 1 MG tablet Take 1 mg by mouth daily.    Yes [provider]  OLANZapine (ZYPREXA) 15 MG tablet Take 15 mg by mouth 2 (two) times daily.   Yes [provider]  HYDROcodone-acetaminophen (NORCO/VICODIN) 5-325 MG per tablet Take 1-2 tablets by mouth every 4 (four) hours as needed for moderate pain. Patient not taking: Reported on 10/31/2016 09/26/14   Gladis RiffleLoflin, Catherine L, MD  oxyCODONE-acetaminophen (ROXICET) 5-325 MG tablet Take 1 tablet by mouth every 6 (six) hours as needed for moderate pain. Patient not taking: Reported on 10/31/2016 08/10/15   Joni ReiningSmith, Ronald K, PA-C    Inpatient Medications:  . aspirin  81 mg Oral Daily  . atorvastatin  40 mg Oral Daily  . benztropine  0.5 mg Oral BID  . budesonide  1 mg Nebulization BID  . carvedilol  6.25 mg Oral BID WC  . enalapril  2.5 mg Oral Daily  . enoxaparin (LOVENOX) injection  40 mg Subcutaneous Q24H  . gabapentin  400 mg Oral TID  . LORazepam  1 mg Oral BID  . OLANZapine  15 mg Oral BID  . pantoprazole  40 mg Oral Daily  . pneumococcal 23 valent vaccine  0.5 mL Intramuscular Tomorrow-1000  . sodium chloride flush  3 mL Intravenous Q12H  . ticagrelor  90 mg Oral BID     Allergies: No Known Allergies  Social History   Social History  . Marital status: Single    Spouse name: N/A  . Number of children: N/A  . Years of education: N/A   Occupational History  . Not on file.   Social History Main Topics  . Smoking status: Current Every Day Smoker    Packs/day: 1.00    Years: 30.00    Types: Cigarettes  . Smokeless tobacco: Never Used  . Alcohol use No  . Drug use: No  . Sexual activity: Not on file   Other Topics Concern  . Not on file   Social History Narrative  . No narrative on file     Family History  Problem Relation Age of Onset  . Heart murmur Mother   . Heart attack Mother   . Breast cancer Mother      ROS:  Please see the history of present illness.     All other systems reviewed and negative.    Physical Exam: Blood pressure 102/68, pulse 74,  temperature 97.6 F (36.4 C), temperature source Oral, resp. rate 18, height 5\' 8"  (1.727 m), weight 151 lb 7.3 oz (68.7 kg), SpO2 98 %. General: Well developed, well nourished male in no acute distress. Head: Normocephalic, atraumatic, sclera non-icteric, no xanthomas, nares are without discharge. EENT: normal Lymph Nodes:  none Back: without scoliosis/kyphosis, no CVA tendersness Neck: Negative for carotid bruits. JVD not elevated. Lungs: Clear bilaterally to auscultation without wheezes, rales, or rhonchi. Breathing is unlabored. Heart: RRR with S1 S2. No murmur , rubs, or gallops appreciated. Abdomen: Soft, non-tender, non-distended with normoactive bowel sounds. No hepatomegaly. No rebound/guarding. No obvious abdominal masses. Msk:  Strength and tone appear normal for age. Extremities: No clubbing or cyanosis. No edema.  Distal pedal pulses are 2+ and equal bilaterally. Skin: Warm and Dry Neuro:  Alert and oriented X 3. CN III-XII intact Grossly normal sensory and motor function . Psych:  Responds to questions appropriately with a normal affect.      Labs: Cardiac Enzymes  Recent Labs  10/30/16 1408 10/30/16 1629 10/30/16 2136 10/31/16 0411  TROPONINI 0.04* 1.51* 2.86* 1.76*   CBC Lab Results  Component Value Date   WBC 13.6 (H) 11/01/2016   HGB 14.7 11/01/2016   HCT 43.7 11/01/2016   MCV 89.1 11/01/2016   PLT 196 11/01/2016   PROTIME:  Recent Labs  10/30/16 1408  LABPROT 13.3  INR 1.02   Chemistry  Recent Labs Lab 10/30/16 1408  11/01/16 0422  NA 134*  < > 139  K 3.5  < > 3.9  CL 104  < > 109  CO2 28  < > 26  BUN 7  < > 11  CREATININE 1.04  < > 0.94  CALCIUM 9.4  < > 8.5*  PROT 8.2*  --   --   BILITOT 0.5  --   --   ALKPHOS 150*  --   --   ALT 21  --   --   AST 25  --   --   GLUCOSE 125*  < > 91  < > = values in this interval not displayed. Lipids Lab Results  Component Value Date   CHOL 137 10/30/2016   HDL 29 (L) 10/30/2016   LDLCALC 77  10/30/2016   TRIG 156 (H) 10/30/2016   BNP No results found for: PROBNP Thyroid Function Tests:  Recent Labs  10/31/16 0428  TSH 3.380      Miscellaneous No results found for: DDIMER  Radiology/Studies:  Dg Chest Port 1 View  Result Date: 10/30/2016 CLINICAL DATA:  Acute onset left-sided chest pain, shortness breath, and diaphoresis. EXAM: PORTABLE CHEST 1 VIEW COMPARISON:  None. FINDINGS: The heart size and mediastinal contours are within normal limits. Both lungs are clear. The visualized skeletal structures are unremarkable. IMPRESSION: No active disease. Electronically Signed   By: Myles Rosenthal M.D.   On: 10/30/2016 14:30    EKG: Personally reviewed  Sinus at 80 16/08/43 T-wave inversions V2-V5  Telemetry Personally reviewed  Ventricular tachycardia-monomorphic for 33 seconds Assessment and Plan:  Ventricular tachycardia-sustained  Cardiomyopathy-ischemic   tachypalpitations-recurrent  Cigarette use/abuse  Schizophrenia    The patient has sustained monomorphic ventricular tachycardia occurring within 48 hours of MI and revascularization. Currently there is no distinguishing VT from VF in this situation although older data had suggested that ventricular tachycardia might be related to substrate and hence not likely to change. EP testing for risk stratification has been utilized in this regard. Importantly, in this patient, he has tachypalpitations similar to what happened yesterday suggesting that ventricular tachycardia may be old and possibly the minimal amount of troponin elevation being far less than the degree of left ventricular dysfunction may suggest that his cardiomyopathy predates the acute event.   In any case,He been offered Life Vest yesterday he is disinclined to wear it. Hence, we will cancel it. In addition, he was offered EP testing for risk stratification with the idea of implantation of a defibrillator if positiveHe is also not inclined towards this.   He would like to continue to take his medications I offered him reassessment in a few months.  Thank you for the consultation     Sherryl Manges

## 2016-11-01 NOTE — Discharge Summary (Signed)
Cardiac Discharge   Aspirin prescribed at discharge: yes  High Intensity Statin Prescribed? (Lipitor 40-80mg  or Crestor 20-40mg ): yes  Beta Blocker Prescribed: yes  ADP Receptor Inhibitor Prescribed? (i.e. Plavix etc.-Includes Medically Managed Patients): yes  ACEI/ARB Prescribed? (If NO document contraindications) yes  Aldosterone Inhibitor Prescribed?no  Was EF assessed during THIS hospitalization? 35-40%  (YES = Measured in current episode of care or document plan to evaluate after discharge.)  Was EF < 40% ?  yes  Was Cardiac Rehab II ordered? (Includes Medically managed Patients): {yes yes

## 2016-11-01 NOTE — Care Management (Signed)
Informed that the patient has declined the zoll vest.  Notified Zoll.  Patient to discharge back to [facility]- owner Viviann SpareSteven [phone]. Owner and patient agreeable to home health SN.  Agency preference is [facility].  Referral called and accepted.  Requested home visit within 24 hours of discharge. Discussed the transition to outpatient cardiac rehab if patient is agreeable. Provided Brilinta coupon

## 2016-11-01 NOTE — Discharge Summary (Signed)
[facility] [place] [facility]   PATIENT NAME: [name]    MR#:  [id number]  DATE OF BIRTH:  08/13/1970  DATE OF ADMISSION:  10/30/2016 ADMITTING PHYSICIAN: Houston SirenVivek J Sainani, MD  DATE OF DISCHARGE: 11/01/2016  PRIMARY CARE PHYSICIAN: [facility]    ADMISSION DIAGNOSIS:  ST elevation myocardial infarction (STEMI), unspecified artery (HCC) [I21.3] Acute ST elevation myocardial infarction (STEMI) involving left anterior descending (LAD) coronary artery (HCC) [I21.02]  DISCHARGE DIAGNOSIS:  Acute STEMI s/p cath with DES in LAD NSVT ---pt declined life vest SECONDARY DIAGNOSIS:   Past Medical History:  Diagnosis Date  . Hypercholesteremia   . Schizophrenia [facility](HCC)     HOSPITAL COURSE:  46 year old male with past medical history of hyperlipidemia, schizophrenia who presented to the hospital with chest pain and noted to have ST elevations in the anterior leads.  1.  ACUTE ST elevation MI-this is a cause of patient's chest pain.  -Patient underwent emergent cardiac catheterization which showed a mid LAD lesion which was stented. -Patient's chronic chest pain-free and hemodynamically stable. -Continue aspirin, Brillinta, carvedilol, atorvastatin, enalapril -Appreciate cardiology help. -pt had long run of VT--recommended Life vest. Pt declined -d/ced Haldol  2. History of schizophrenia-continue Zyprexa but dc/ed Haldol due to VT Seen by   3. Anxiety-continue Ativan.  4. Neuropathy-continue gabapentin.  5. GERD-continue Protonix.  D/c to group home with out pt f/u with Dr Kirke Corinarida  CM to help with meds if needed  CONSULTS OBTAINED:  Treatment Team:  Iran OuchArida, Muhammad A, MD Clapacs, Jackquline DenmarkJohn T, MD  DRUG ALLERGIES:  No Known Allergies  DISCHARGE MEDICATIONS:   Current Discharge Medication List    START taking these medications   Details  aspirin 81 MG chewable tablet Chew 1 tablet (81 mg total) by mouth  daily. Qty: 30 tablet, Refills: 0    carvedilol (COREG) 6.25 MG tablet Take 1 tablet (6.25 mg total) by mouth 2 (two) times daily with a meal. Qty: 60 tablet, Refills: 0    enalapril (VASOTEC) 2.5 MG tablet Take 1 tablet (2.5 mg total) by mouth daily. Qty: 30 tablet, Refills: 0    ticagrelor (BRILINTA) 90 MG TABS tablet Take 1 tablet (90 mg total) by mouth 2 (two) times daily. Qty: 60 tablet, Refills: 1      CONTINUE these medications which have NOT CHANGED   Details  albuterol (PROVENTIL HFA;VENTOLIN HFA) 108 (90 BASE) MCG/ACT inhaler Inhale 2 puffs into the lungs every 4 (four) hours as needed for wheezing or shortness of breath.    atorvastatin (LIPITOR) 40 MG tablet Take 40 mg by mouth every evening.     benztropine (COGENTIN) 0.5 MG tablet Take 0.5 mg by mouth 2 (two) times daily.    esomeprazole (NEXIUM) 40 MG capsule Take 40 mg by mouth 2 (two) times daily before a meal.     fluticasone (FLONASE) 50 MCG/ACT nasal spray Place 2 sprays into both nostrils 2 (two) times daily.    fluticasone (FLOVENT HFA) 110 MCG/ACT inhaler Inhale 2 puffs into the lungs 2 (two) times daily.    gabapentin (NEURONTIN) 400 MG capsule Take 400 mg by mouth 3 (three) times daily.    hydrOXYzine (VISTARIL) 50 MG capsule Take 50 mg by mouth 4 (four) times daily as needed for anxiety (agitation. Limit 4 caps per 24 hours.).    LORazepam (ATIVAN) 1 MG tablet Take 1 mg by mouth daily.     OLANZapine (ZYPREXA) 15 MG tablet Take 15 mg by  mouth 2 (two) times daily.      STOP taking these medications     haloperidol (HALDOL) 5 MG tablet      ibuprofen (ADVIL,MOTRIN) 600 MG tablet      HYDROcodone-acetaminophen (NORCO/VICODIN) 5-325 MG per tablet      oxyCODONE-acetaminophen (ROXICET) 5-325 MG tablet         If you experience worsening of your admission symptoms, develop shortness of breath, life threatening emergency, suicidal or homicidal thoughts you must seek medical attention immediately by  calling 911 or calling your MD immediately  if symptoms less severe.  You Must read complete instructions/literature along with all the possible adverse reactions/side effects for all the Medicines you take and that have been prescribed to you. Take any new Medicines after you have completely understood and accept all the possible adverse reactions/side effects.   Please note  You were cared for by a hospitalist during your hospital stay. If you have any questions about your discharge medications or the care you received while you were in the hospital after you are discharged, you can call the unit and asked to speak with the hospitalist on call if the hospitalist that took care of you is not available. Once you are discharged, your primary care physician will handle any further medical issues. Please note that NO REFILLS for any discharge medications will be authorized once you are discharged, as it is imperative that you return to your primary care physician (or establish a relationship with a primary care physician if you do not have one) for your aftercare needs so that they can reassess your need for medications and monitor your lab values. Today   SUBJECTIVE   Doing well  VITAL SIGNS:  Blood pressure 102/68, pulse 74, temperature 97.6 F (36.4 C), temperature source Oral, resp. rate 18, height 5\' 8"  (1.727 m), weight 68.7 kg (151 lb 7.3 oz), SpO2 98 %.  I/O:   Intake/Output Summary (Last 24 hours) at 11/01/16 0949 Last data filed at 10/31/16 1850  Gross per 24 hour  Intake              490 ml  Output              350 ml  Net              140 ml    PHYSICAL EXAMINATION:  GENERAL:  46 y.o.-year-old patient lying in the bed with no acute distress.  EYES: Pupils equal, round, reactive to light and accommodation. No scleral icterus. Extraocular muscles intact.  HEENT: Head atraumatic, normocephalic. Oropharynx and nasopharynx clear.  NECK:  Supple, no jugular venous distention. No  thyroid enlargement, no tenderness.  LUNGS: Normal breath sounds bilaterally, no wheezing, rales,rhonchi or crepitation. No use of accessory muscles of respiration.  CARDIOVASCULAR: S1, S2 normal. No murmurs, rubs, or gallops.  ABDOMEN: Soft, non-tender, non-distended. Bowel sounds present. No organomegaly or mass.  EXTREMITIES: No pedal edema, cyanosis, or clubbing.  NEUROLOGIC: Cranial nerves II through XII are intact. Muscle strength 5/5 in all extremities. Sensation intact. Gait not checked.  PSYCHIATRIC: The patient is alert and oriented x 3.  SKIN: No obvious rash, lesion, or ulcer.   DATA REVIEW:   CBC   Recent Labs Lab 11/01/16 0422  WBC 13.6*  HGB 14.7  HCT 43.7  PLT 196    Chemistries   Recent Labs Lab 10/30/16 1408  11/01/16 0422  NA 134*  < > 139  K 3.5  < > 3.9  CL 104  < > 109  CO2 28  < > 26  GLUCOSE 125*  < > 91  BUN 7  < > 11  CREATININE 1.04  < > 0.94  CALCIUM 9.4  < > 8.5*  MG  --   < > 2.3  AST 25  --   --   ALT 21  --   --   ALKPHOS 150*  --   --   BILITOT 0.5  --   --   < > = values in this interval not displayed.  Microbiology Results   Recent Results (from the past 240 hour(s))  MRSA PCR Screening     Status: None   Collection Time: 10/30/16  4:23 PM  Result Value Ref Range Status   MRSA by PCR NEGATIVE NEGATIVE Final    Comment:        The GeneXpert MRSA Assay (FDA approved for NASAL specimens only), is one component of a comprehensive MRSA colonization surveillance program. It is not intended to diagnose MRSA infection nor to guide or monitor treatment for MRSA infections.     RADIOLOGY:  Dg Chest Port 1 View  Result Date: 10/30/2016 CLINICAL DATA:  Acute onset left-sided chest pain, shortness breath, and diaphoresis. EXAM: PORTABLE CHEST 1 VIEW COMPARISON:  None. FINDINGS: The heart size and mediastinal contours are within normal limits. Both lungs are clear. The visualized skeletal structures are unremarkable. IMPRESSION:  No active disease. Electronically Signed   By: Myles Rosenthal M.D.   On: 10/30/2016 14:30     Management plans discussed with the patient, family and they are in agreement.  CODE STATUS:     Code Status Orders        Start     Ordered   10/30/16 1623  Full code  Continuous     10/30/16 1622    Code Status History    Date Active Date Inactive Code Status Order ID Comments User Context   09/19/2014  3:08 AM 09/26/2014  8:33 PM Full Code [id number]  Lattie Haw, MD ED      TOTAL TIME TAKING CARE OF THIS PATIENT: *40* minutes.    Clint Biello M.D on 11/01/2016 at 9:49 AM  Between 7am to 6pm - Pager - [phone] After 6pm go to [email or web address] - Social research officer, government  [facility]  Office  [phone]  CC: Primary care physician; Center, [facility]

## 2016-11-01 NOTE — [place] FL2 (Signed)
[place] MEDICAID FL2 LEVEL OF CARE SCREENING TOOL     IDENTIFICATION  Patient Name: [name] Birthdate: 01/19/1970 Sex: male Admission Date (Current Location): 10/30/2016  [facility] [place]   Facility and Address:  [facility], [address], [place], [place] [address]      Provider Number: [id number]  Attending Physician Name and Address:  Enedina FinnerPatel, Sona, MD  Relative Name and Phone Number:       Current Level of Care: Hospital Recommended Level of Care[facility] Prior Approval Number:    Date Approved/Denied:   PASRR Number:    Discharge Plan: Other (Comment) (Return back to family care home)    Current Diagnoses: Patient Active Problem List   Diagnosis Date Noted  . Schizophrenia (HCC) 10/31/2016  . Acute ST elevation myocardial infarction (STEMI) involving left anterior descending (LAD) coronary artery (HCC) 10/30/2016  . ST elevation myocardial infarction (STEMI) (HCC)   . Biliary colic   . Encounter for fitting of gastrointestinal device   . Abdominal pain, acute   . Hyperbilirubinemia   . Choledocholithiasis 09/19/2014  . Transaminitis     Orientation RESPIRATION BLADDER Height & Weight     Self, Time, Situation, Place    Continent Weight: 151 lb 7.3 oz (68.7 kg) Height:  5\' 8"  (172.7 cm)  BEHAVIORAL SYMPTOMS/MOOD NEUROLOGICAL BOWEL NUTRITION STATUS      Continent Diet (Carb Modified)  AMBULATORY STATUS COMMUNICATION OF NEEDS Skin   Independent Verbally Normal                       Personal Care Assistance Level of Assistance  Bathing, Feeding, Dressing Bathing Assistance: Limited assistance Feeding assistance: Independent Dressing Assistance: Independent     Functional Limitations Info  Sight, Hearing, Speech Sight Info: Adequate Hearing Info: Adequate Speech Info: Adequate    SPECIAL CARE FACTORS FREQUENCY                       Contractures Contractures Info: Not  present    Additional Factors Info  Code Status, Allergies, Psychotropic Code Status Info: Full Code Allergies Info: nka Psychotropic Info: LORazepam (ATIVAN) tablet 1 mg and OLANZapine (ZYPREXA) tablet 15 mg         Current Medications (11/01/2016):  This is the current hospital active medication list Current Facility-Administered Medications  Medication Dose Route Frequency Provider Last Rate Last Dose  . 0.9 %  sodium chloride infusion  250 mL Intravenous PRN Lorine BearsArida, Muhammad A, MD      . albuterol (PROVENTIL) (2.5 MG/3ML) 0.083% nebulizer solution 2.5 mg  2.5 mg Nebulization Q4H PRN Lorine BearsArida, Muhammad A, MD      . aspirin chewable tablet 81 mg  81 mg Oral Daily Lorine BearsArida, Muhammad A, MD   81 mg at 11/01/16 0948  . atorvastatin (LIPITOR) tablet 40 mg  40 mg Oral Daily Lorine BearsArida, Muhammad A, MD   40 mg at 11/01/16 0948  . benztropine (COGENTIN) tablet 0.5 mg  0.5 mg Oral BID Lorine BearsArida, Muhammad A, MD   0.5 mg at 11/01/16 0949  . budesonide (PULMICORT) nebulizer solution 1 mg  1 mg Nebulization BID Lorine BearsArida, Muhammad A, MD   1 mg at 11/01/16 0759  . carvedilol (COREG) tablet 6.25 mg  6.25 mg Oral BID WC Eula Listenunn, [name] M, PA-C   6.25 mg at 11/01/16 0948  . enalapril (VASOTEC) tablet 2.5 mg  2.5 mg Oral Daily Erin FullingKasa, Kurian, MD  2.5 mg at 11/01/16 0949  . enoxaparin (LOVENOX) injection 40 mg  40 mg Subcutaneous Q24H Lorine Bears A, MD   40 mg at 10/31/16 0952  . gabapentin (NEURONTIN) capsule 400 mg  400 mg Oral TID Iran Ouch, MD   400 mg at 11/01/16 0948  . HYDROcodone-acetaminophen (NORCO/VICODIN) 5-325 MG per tablet 1-2 tablet  1-2 tablet Oral Q4H PRN Lorine Bears A, MD      . hydrOXYzine (ATARAX/VISTARIL) tablet 50 mg  50 mg Oral QID PRN Lorine Bears A, MD      . LORazepam (ATIVAN) tablet 1 mg  1 mg Oral BID Iran Ouch, MD   1 mg at 11/01/16 0948  . OLANZapine (ZYPREXA) tablet 15 mg  15 mg Oral BID Lorine Bears A, MD   15 mg at 11/01/16 0948  . oxyCODONE-acetaminophen  (PERCOCET/ROXICET) 5-325 MG per tablet 1 tablet  1 tablet Oral Q6H PRN Lorine Bears A, MD      . pantoprazole (PROTONIX) EC tablet 40 mg  40 mg Oral Daily Lorine Bears A, MD   40 mg at 11/01/16 0948  . pneumococcal 23 valent vaccine (PNU-IMMUNE) injection 0.5 mL  0.5 mL Intramuscular Tomorrow-1000 Sainani, Vivek J, MD      . sodium chloride flush (NS) 0.9 % injection 3 mL  3 mL Intravenous Q12H Iran Ouch, MD   3 mL at 10/31/16 2139  . sodium chloride flush (NS) 0.9 % injection 3 mL  3 mL Intravenous PRN Iran Ouch, MD      . ticagrelor (BRILINTA) tablet 90 mg  90 mg Oral BID Iran Ouch, MD   90 mg at 11/01/16 1610     Discharge Medications: Please see discharge summary for a list of discharge medications.  START taking these medications   Details  aspirin 81 MG chewable tablet Chew 1 tablet (81 mg total) by mouth daily. Qty: 30 tablet, Refills: 0    carvedilol (COREG) 6.25 MG tablet Take 1 tablet (6.25 mg total) by mouth 2 (two) times daily with a meal. Qty: 60 tablet, Refills: 0    enalapril (VASOTEC) 2.5 MG tablet Take 1 tablet (2.5 mg total) by mouth daily. Qty: 30 tablet, Refills: 0    ticagrelor (BRILINTA) 90 MG TABS tablet Take 1 tablet (90 mg total) by mouth 2 (two) times daily. Qty: 60 tablet, Refills: 1          CONTINUE these medications which have NOT CHANGED   Details  albuterol (PROVENTIL HFA;VENTOLIN HFA) 108 (90 BASE) MCG/ACT inhaler Inhale 2 puffs into the lungs every 4 (four) hours as needed for wheezing or shortness of breath.    atorvastatin (LIPITOR) 40 MG tablet Take 40 mg by mouth every evening.     benztropine (COGENTIN) 0.5 MG tablet Take 0.5 mg by mouth 2 (two) times daily.    esomeprazole (NEXIUM) 40 MG capsule Take 40 mg by mouth 2 (two) times daily before a meal.     fluticasone (FLONASE) 50 MCG/ACT nasal spray Place 2 sprays into both nostrils 2 (two) times daily.    fluticasone (FLOVENT HFA) 110 MCG/ACT  inhaler Inhale 2 puffs into the lungs 2 (two) times daily.    gabapentin (NEURONTIN) 400 MG capsule Take 400 mg by mouth 3 (three) times daily.    hydrOXYzine (VISTARIL) 50 MG capsule Take 50 mg by mouth 4 (four) times daily as needed for anxiety (agitation. Limit 4 caps per 24 hours.).    LORazepam (ATIVAN) 1 MG  tablet Take 1 mg by mouth daily.     OLANZapine (ZYPREXA) 15 MG tablet Take 15 mg by mouth 2 (two) times daily.         STOP taking these medications     haloperidol (HALDOL) 5 MG tablet      ibuprofen (ADVIL,MOTRIN) 600 MG tablet      HYDROcodone-acetaminophen (NORCO/VICODIN) 5-325 MG per tablet      oxyCODONE-acetaminophen (ROXICET) 5-325 MG tablet     Relevant Imaging Results:  Relevant Lab Results:   Additional Information    Laela Deviney, Ervin Knack, LCSWA

## 2016-11-01 NOTE — Progress Notes (Signed)
Progress Note  Patient Name: [name] Date of Encounter: 11/01/2016  Primary Cardiologist: New to [facility] - consult by Arida  Subjective   No chest pain or SOB. No further VT. Vitals stable. Ambulated without issues. TTE showed EF 35-40% with severe hypokinesis of themid-apicalanteroseptal, anterior, anterolateral, and apical myocardium. Lytes ok. TSH normal. Tolerating medications. Seen by psychiatry, stopped Haldol given QT and evidence of VT.   Inpatient Medications    Scheduled Meds: . aspirin  81 mg Oral Daily  . atorvastatin  40 mg Oral Daily  . benztropine  0.5 mg Oral BID  . budesonide  1 mg Nebulization BID  . carvedilol  6.25 mg Oral BID WC  . enalapril  2.5 mg Oral Daily  . enoxaparin (LOVENOX) injection  40 mg Subcutaneous Q24H  . gabapentin  400 mg Oral TID  . LORazepam  1 mg Oral BID  . OLANZapine  15 mg Oral BID  . pantoprazole  40 mg Oral Daily  . pneumococcal 23 valent vaccine  0.5 mL Intramuscular Tomorrow-1000  . sodium chloride flush  3 mL Intravenous Q12H  . ticagrelor  90 mg Oral BID   Continuous Infusions: . sodium chloride     PRN Meds: sodium chloride, albuterol, HYDROcodone-acetaminophen, hydrOXYzine, oxyCODONE-acetaminophen, sodium chloride flush   Vital Signs    Vitals:   10/31/16 1405 10/31/16 1938 11/01/16 0350 11/01/16 0759  BP: 130/80 107/71 102/68   Pulse: 80 84 74   Resp: 20  18   Temp: 98.3 F (36.8 C) 97.8 F (36.6 C) 97.6 F (36.4 C)   TempSrc:   Oral   SpO2: 96% 97% 98% 98%  Weight:      Height:        Intake/Output Summary (Last 24 hours) at 11/01/16 0926 Last data filed at 10/31/16 1850  Gross per 24 hour  Intake              490 ml  Output              350 ml  Net              140 ml   Filed Weights   10/30/16 1408 10/30/16 1623  Weight: 147 lb (66.7 kg) 151 lb 7.3 oz (68.7 kg)    Telemetry    NSR, no further VT - Personally Reviewed  ECG    n/a - Personally Reviewed  Physical Exam   GEN: No acute  distress.   Neck: No JVD. Cardiac: RRR, no murmurs, rubs, or gallops. Left radial cath site without bleeding, bruising, swelling, erythema or TTP. Radial pulse 2+. Respiratory: Clear to auscultation bilaterally.  GI: Soft, nontender, non-distended.   MS: No edema; No deformity. Neuro:  Alert and oriented x 3; Nonfocal.  Psych: Normal affect.  Labs    Chemistry Recent Labs Lab 10/30/16 1408 10/30/16 1629 10/31/16 0411 11/01/16 0422  NA 134*  --  140 139  K 3.5  --  3.9 3.9  CL 104  --  109 109  CO2 28  --  26 26  GLUCOSE 125*  --  93 91  BUN 7  --  5* 11  CREATININE 1.04 0.91 0.83 0.94  CALCIUM 9.4  --  8.6* 8.5*  PROT 8.2*  --   --   --   ALBUMIN 3.8  --   --   --   AST 25  --   --   --   ALT 21  --   --   --  ALKPHOS 150*  --   --   --   BILITOT 0.5  --   --   --   GFRNONAA >60 >60 >60 >60  GFRAA >60 >60 >60 >60  ANIONGAP 2*  --  5 4*     Hematology Recent Labs Lab 10/30/16 1629 10/31/16 0411 11/01/16 0422  WBC 18.4* 12.3* 13.6*  RBC 4.92 4.90 4.91  HGB 15.0 14.8 14.7  HCT 43.3 43.5 43.7  MCV 88.0 88.6 89.1  MCH 30.4 30.2 30.0  MCHC 34.5 34.1 33.6  RDW 14.1 14.1 14.0  PLT 225 209 196    Cardiac Enzymes Recent Labs Lab 10/30/16 1408 10/30/16 1629 10/30/16 2136 10/31/16 0411  TROPONINI 0.04* 1.51* 2.86* 1.76*   No results for input(s): TROPIPOC in the last 168 hours.   BNPNo results for input(s): BNP, PROBNP in the last 168 hours.   DDimer No results for input(s): DDIMER in the last 168 hours.   Radiology    Dg Chest Port 1 View  Result Date: 10/30/2016 IMPRESSION: No active disease. Electronically Signed   By: Myles Rosenthal M.D.   On: 10/30/2016 14:30    Cardiac Studies   LHC 10/30/2016: Coronary Findings   Dominance: Left  Left Main  Vessel is angiographically normal.  Left Anterior Descending  Mid LAD lesion, 95% stenosed. The lesion is type C and thrombotic.  Angioplasty: Lesion crossed with guidewire. Pre-stent angioplasty was  performed. A drug eluting stent was successfully placed. Post-stent angioplasty was performed. The pre-interventional distal flow is normal (TIMI 3). The post-interventional distal flow is normal (TIMI 3). The intervention was successful . No complications occurred at this lesion.  There is no residual stenosis post intervention.  First Diagonal Branch  Vessel is small in size. Vessel is angiographically normal.  Second Diagonal Branch  Vessel is angiographically normal.  Third Diagonal Branch  Vessel is small in size. Vessel is angiographically normal.  Left Circumflex  The vessel exhibits minimal luminal irregularities.  First Obtuse Marginal Branch  The vessel exhibits minimal luminal irregularities.  Second Obtuse Marginal Branch  The vessel exhibits minimal luminal irregularities.  Third Obtuse Marginal Branch  Vessel is angiographically normal.  Fourth Obtuse Marginal Branch  The vessel exhibits minimal luminal irregularities.  Left Posterior Descending Artery  The vessel exhibits minimal luminal irregularities.  Right Coronary Artery  The vessel exhibits minimal luminal irregularities.  Wall Motion              Left Heart   Left Ventricle The left ventricular size is normal. There is mild to moderate left ventricular systolic dysfunction. LV end diastolic pressure is mildly elevated. The left ventricular ejection fraction is 35-45% by visual estimate. There are LV function abnormalities due to segmental dysfunction.    Coronary Diagrams   Diagnostic Diagram       Post-Intervention Diagram         TTE 10/31/2016: Study Conclusions  - Left ventricle: The cavity size was normal. There was mild   concentric hypertrophy. Systolic function was moderately reduced.   The estimated ejection fraction was in the range of 35% to 40%.   Severe hypokinesis of the mid-apicalanteroseptal, anterior,   anterolateral, and apical myocardium. Doppler parameters are    consistent with abnormal left ventricular relaxation (grade 1   diastolic dysfunction). - Mitral valve: There was mild regurgitation. - Pericardium, extracardiac: A trivial pericardial effusion was   identified posterior to the heart.  Patient Profile     46 y.o. male with  history of schizophrenia, HLD, and tobacco who was admitted 10/21 with anterior ST elevation MI.   Assessment & Plan    1. Anterior ST elevation MI: -Currently, without chest pain -Status post PCI/DES to the mid LAD as above -Troponin peaked at 2.86, now down trending -Echo pending, EF 35-40% by LV gram with severe anterior, apical and distal inferior hypokinesis -Continue uninterrupted DAPT with ASA 81 mg daily and Brilinta 90 mg bid for at least 12 months -Coreg 6.25 mg bid -Transfer to 2A today -PT -Cardiac rehab  2. Monomorphic VT: -Episode of monomorphic VT at 14:00-14:01 on 10/22 -Magnesium at goal  -Potassium repleted -TSH normal -Increased Coreg to 6.25 mg bid -Seen by EP, patient declines LifeVest and EP study -Seen by psychiatry with discontinuation of Haldol being recommended -Outpatient follow up  3. ICM: -He does not appear volume up at this time -Continue Coreg as above -Continue enalapril  -Consider adding spironolactone as an outpatient -Plan for repeat limited echo as an outpatient in ~ 3 months to evaluate for improved EF -CHF education  4. HLD: -Lipitor 40 mg daily -LDL 77, plan for outpatient lipid and liver function in 8 weeks  5. Tobacco abuse: -Cessation advised   6. Schizophrenia: -Lives in a group home -Seen by psychiatry with Haldol being held in the setting of QTc and VT -Per IM  For questions or updates, please contact [facility] Please consult [email or web address] for contact info under Cardiology/STEMI.    Signed, Eula Listenyan Dunn, PA-C [facility] Pager: [phone] 11/01/2016, 9:26 AM

## 2016-11-01 NOTE — Plan of Care (Signed)
Problem: Cardiac: Goal: Ability to achieve and maintain adequate cardiopulmonary perfusion will improve Outcome: Progressing No further runs of Vtach noted on telemetry.

## 2016-11-01 NOTE — Progress Notes (Addendum)
Patient referred to Cardiac Rehab with dx of STEMI.  Patient underwent cardiac cath with DES to LAD. EF on echo was 35 - 40%.   Patient has hx of HLD and Schizophrenia.   Post-cath patient had an episode of V-Tach.  Life Vest was ordered for patient, but patient declined.  Psych consult obtained with recommendation to stop Haldol due to possible side effects of prolonged QT.   Patient prescribed Asa, Brilinta, Carvedilol, Lipitor, and Enalapril.   "Bouncing Back from Heart Attack" booklet given and reviewed with patient.  Shared with patient a diagram demonstrating the location of where his artery was blocked.  Shared with patient a picture of blocked artery along with what stent looks like.  Reviewed with patient the risk factors of heart disease.  Patient is not a diabetic.  Patient stated he buys bags of tobacco and smokes a pipe.  Smoking cessation counseling provided.  Patient stated he quit one other time for about 9 months.  Patient informed this RN that he enjoys smoking and is not ready to quit.  Diet reviewed with patient.  The importance of exercise was discussed.  Patient stated he walks to the store every day to buy tobacco and sodas and that is his exercise.  I explained to patient Dr. Kirke CorinArida has referred him to Cardiac Rehab.  An overview of the program was provided. Benefits of Cardiac Rehab were discussed.   Patient verbalized uncertainty as to whether or not the Group Home where he lives would bring him to Cardiac Rehab.  [facility] staff at [facility] about patient's care/meds.  [facility], patient would be a candidate for outpatient Cardiac Rehab.  Patient stated he would like to participate.  Will need to talk to owner/manager of Group Home to see if they are willing to bring patient to Cardiac Rehab or if there is some means of transportation for patient.    Patient has follow-up appointment with Dr. Kirke CorinArida.    Army Meliaiane  Judythe Postema, RN, BSN, [facility] Cardiovascular and Pulmonary Nurse Navigator

## 2016-11-01 NOTE — Telephone Encounter (Signed)
TCM ph [facility] STEMI  Needs 7-10 s/p cath with Arida   Scheduled next available 11-20 at 11 Added to waitlist   FYI Patient lived in Group home

## 2016-11-02 NOTE — Telephone Encounter (Signed)
[facility]   Patient understands to follow up with provider ? [facility] Ward Givenshris Berge, NP.  Patient understands discharge instructions? Yes   Patient understands medications and regiment? Yes   Patient understands to bring all medications to this visit? Yes  Patient lives in a group home. He states the worker in the group home has his information and they are helping him with his medications and appointments.

## 2016-11-29 ENCOUNTER — Ambulatory Visit (INDEPENDENT_AMBULATORY_CARE_PROVIDER_SITE_OTHER): Payer: Medicare Other | Admitting: Nurse Practitioner

## 2016-11-29 ENCOUNTER — Encounter: Payer: Self-pay | Admitting: Nurse Practitioner

## 2016-11-29 VITALS — BP 100/72 | HR 70 | Ht 68.0 in | Wt 147.5 lb

## 2016-11-29 DIAGNOSIS — I472 Ventricular tachycardia: Secondary | ICD-10-CM | POA: Diagnosis not present

## 2016-11-29 DIAGNOSIS — I255 Ischemic cardiomyopathy: Secondary | ICD-10-CM

## 2016-11-29 DIAGNOSIS — Z720 Tobacco use: Secondary | ICD-10-CM

## 2016-11-29 DIAGNOSIS — I2109 ST elevation (STEMI) myocardial infarction involving other coronary artery of anterior wall: Secondary | ICD-10-CM

## 2016-11-29 DIAGNOSIS — E785 Hyperlipidemia, unspecified: Secondary | ICD-10-CM | POA: Diagnosis not present

## 2016-11-29 DIAGNOSIS — I2510 Atherosclerotic heart disease of native coronary artery without angina pectoris: Secondary | ICD-10-CM | POA: Diagnosis not present

## 2016-11-29 DIAGNOSIS — F209 Schizophrenia, unspecified: Secondary | ICD-10-CM

## 2016-11-29 DIAGNOSIS — I4729 Other ventricular tachycardia: Secondary | ICD-10-CM

## 2016-11-29 MED ORDER — CLOPIDOGREL BISULFATE 75 MG PO TABS: 75.0000 mg | ORAL_TABLET | Freq: Every day | ORAL | 3 refills | Status: DC

## 2016-11-29 NOTE — Progress Notes (Signed)
Office Visit    Patient Name: [name] Date of Encounter: 11/29/2016  Primary Care Provider:  [facility] Primary Cardiologist:  Judie PetitM. Kirke CorinArida, MD   Chief Complaint    46 y/o ? with a history of schizophrenia and tobacco abuse, status post recent anterior ST elevation myocardial infarction with LAD stenting and finding of ischemic cardiomyopathy, monomorphic VT, who presents for follow-up.  Past Medical History    Past Medical History:  Diagnosis Date  . CAD (coronary artery disease)    a. 10/2016 Ant STEMI/PCI: LM nl, LAD 95 (3.0x19 Resolute Onyx DES), D1/2/3 nl, LCX min irregs, OM1/2/3/4 min irregs, RCA min irregs, EF 35-45%.  . Hypercholesteremia   . Ischemic cardiomyopathy    a. 10/2016 Echo: EF 35-40%, Severe mid-apicalanteroseptal, ant, antlat, apical HK. Gr1 DD. Mild MR.  . Monomorphic ventricular tachycardia (HCC)    a. 10/2016 following Ant MI/stenting-->refused LifeVest and EP study.  . Schizophrenia (HCC)   . Tobacco abuse    Past Surgical History:  Procedure Laterality Date  . CHOLECYSTECTOMY N/A 09/20/2014   Procedure: LAPAROSCOPIC CHOLECYSTECTOMY;  Surgeon: Natale LayMark Bird, MD;  Location: [facility] ORS;  Service: General;  Laterality: N/A;  . CORONARY/GRAFT ACUTE MI REVASCULARIZATION N/A 10/30/2016   Procedure: Coronary/Graft Acute MI Revascularization;  Surgeon: Iran OuchArida, Muhammad A, MD;  Location: [facility] INVASIVE CV LAB;  Service: Cardiovascular;  Laterality: N/A;  . ENDOSCOPIC RETROGRADE CHOLANGIOPANCREATOGRAPHY (ERCP) WITH PROPOFOL N/A 09/22/2014   Procedure: ENDOSCOPIC RETROGRADE CHOLANGIOPANCREATOGRAPHY (ERCP) WITH PROPOFOL;  Surgeon: Wallace CullensPaul Y Oh, MD;  Location: [facility] ENDOSCOPY;  Service: Endoscopy;  Laterality: N/A;  . ERCP N/A 09/25/2014   Procedure: ENDOSCOPIC RETROGRADE CHOLANGIOPANCREATOGRAPHY (ERCP);  Surgeon: Midge Miniumarren Wohl, MD;  [facility];  Service: Endoscopy;  Laterality: N/A;  . LEFT HEART CATH AND CORONARY ANGIOGRAPHY N/A 10/30/2016    Procedure: LEFT HEART CATH AND CORONARY ANGIOGRAPHY;  Surgeon: Iran OuchArida, Muhammad A, MD;  Location: [facility] INVASIVE CV LAB;  Service: Cardiovascular;  Laterality: N/A;    Allergies  No Known Allergies  History of Present Illness    46 year old ? with the above past medical history including schizophrenia and tobacco abuse.  He was recently admitted in the setting of anterior ST segment elevation myocardial infarction in October of this year with emergent catheterization revealing a thrombotic subtotal occlusion of the proximal LAD and otherwise nonobstructive disease.  EF was 35-40% by echocardiogram.  The LAD was successfully treated with a drug-eluting stent.  Post procedure, he was noted to have monomorphic ventricular tachycardia which was symptomatic.  He was seen by electrophysiology with recommendation for life vest and also electrophysiology study.  Patient refused both.  He was subsequently discharged home on aspirin, Brilinta, statin, beta-blocker, and ACE inhibitor therapy.  Since his discharge, he has done reasonably well.  He has not been having any chest pain or limitations in his activities.  He is not planning on pursuing cardiac rehab.  He has noted intermittent episodes of dyspnea that typically occur at rest, last a minute or so, and resolve spontaneously.  There is no rhyme or reason to the occurrence of the symptoms.  He denies dyspnea on exertion, palpitations, PND, orthopnea, dizziness, syncope, edema, or early satiety.  Home Medications    Prior to Admission medications   Medication Sig Start Date End Date Taking? Authorizing Provider  albuterol (PROVENTIL HFA;VENTOLIN HFA) 108 (90 BASE) MCG/ACT inhaler Inhale 2 puffs into the lungs every 4 (four) hours as needed for wheezing or shortness of breath.   Yes  [provider]  aspirin 81 MG chewable tablet Chew 1 tablet (81 mg total) by mouth daily. 11/01/16  Yes Enedina FinnerPatel, Sona, MD  atorvastatin (LIPITOR) 40 MG tablet Take 40  mg by mouth every evening.    Yes [provider]  benztropine (COGENTIN) 0.5 MG tablet Take 0.5 mg by mouth 2 (two) times daily.   Yes [provider]  carvedilol (COREG) 6.25 MG tablet Take 1 tablet (6.25 mg total) by mouth 2 (two) times daily with a meal. 11/01/16  Yes Enedina FinnerPatel, Sona, MD  enalapril (VASOTEC) 2.5 MG tablet Take 1 tablet (2.5 mg total) by mouth daily. 11/01/16  Yes Enedina FinnerPatel, Sona, MD  esomeprazole (NEXIUM) 40 MG capsule Take 40 mg by mouth 2 (two) times daily before a meal.    Yes [provider]  fluticasone (FLONASE) 50 MCG/ACT nasal spray Place 2 sprays into both nostrils 2 (two) times daily.   Yes [provider]  fluticasone (FLOVENT HFA) 110 MCG/ACT inhaler Inhale 2 puffs into the lungs 2 (two) times daily.   Yes [provider]  gabapentin (NEURONTIN) 400 MG capsule Take 400 mg by mouth 3 (three) times daily.   Yes [provider]  hydrOXYzine (VISTARIL) 50 MG capsule Take 50 mg by mouth 4 (four) times daily as needed for anxiety (agitation. Limit 4 caps per 24 hours.).   Yes [provider]  LORazepam (ATIVAN) 1 MG tablet Take 1 mg by mouth daily.    Yes [provider]  OLANZapine (ZYPREXA) 15 MG tablet Take 15 mg by mouth 2 (two) times daily.   Yes [provider]  clopidogrel (PLAVIX) 75 MG tablet Take 1 tablet (75 mg total) by mouth daily. 11/29/16   Ok AnisBerge, Christopher R, NP    Review of Systems    Vague, intermittent episodes of dyspnea occurring at rest lasting a minute or less and resolving spontaneously.  He denies chest pain, palpitations, pnd, orthopnea, n, v, dizziness, syncope, edema, weight gain, or early satiety.  All other systems reviewed and are otherwise negative except as noted above.  Physical Exam    VS:  BP 100/72 (BP Location: Left Arm, Patient Position: Sitting, Cuff Size: Normal)   Pulse 70   Ht 5\' 8"  (1.727 m)   Wt 147 lb 8 oz (66.9 kg)   BMI 22.43 kg/m  , BMI Body  mass index is 22.43 kg/m. GEN: Well nourished, well developed, in no acute distress.  HEENT: normal.  Neck: Supple, no JVD, carotid bruits, or masses. Cardiac: RRR, no murmurs, rubs, or gallops. No clubbing, cyanosis, edema.  Radials/DP/PT 2+ and equal bilaterally.  Respiratory:  Respirations regular and unlabored, clear to auscultation bilaterally. GI: Soft, nontender, nondistended, BS + x 4. MS: no deformity or atrophy. Skin: warm and dry, no rash. Neuro:  Strength and sensation are intact. Psych: Normal affect.  Accessory Clinical Findings    ECG -regular sinus rhythm, 70 inferior and anterolateral T inversion-similar to prior ECGs.  Assessment & Plan    1.  Anterior ST segment elevation myocardial infarction, subsequent episode of care/coronary artery disease: Status post admission with anterior STEMI in October with resultant PCI and drug-eluting stent placement to the LAD.  He has done well without chest pain since his hospitalization though has noted some vague dyspnea, typically occurring at rest, lasting from a few seconds to a minute, and resolving spontaneously.  I suspect that this is secondary to Brilinta.  His event occurred on October 21 and he was discharged  October 23.  I have asked him to finish his first months worth of Brilinta, which will run out of within the next few days, and at that point, he will transition over to Plavix 75 mg daily.  Continue aspirin, statin, beta-blocker, and ACE inhibitor.  Though I did encourage him, he does not think he will be interested in cardiac rehab.  2.  Ischemic cardiomyopathy: EF 35-40% by echo during hospitalization.  Euvolemic on exam.  He has had some dyspnea but not on exertion.  I suspect this is more likely to be secondary to Brilinta therapy.  Continue beta-blocker and ACE inhibitor.  No room to add Spironolactone or consider transitioning to [facility].  Plan follow-up echo in approximately 2 more months.  3.  Hyperlipidemia: LDL  was 77 on October 21.  Continue high potency statin therapy and plan to follow-up lipids and LFTs at next visit.  4.  Monomorphic ventricular tachycardia: Noted during hospitalization.  He denies any recurrent palpitations, presyncope, or syncope.  He was seen by electrophysiology during his hospitalization with recommendation for LifeVest and EP study.  The patient refused it is not interested in pursuing either at this time.  Continue beta-blocker therapy.  5.  Schizophrenia: Stable on prior meds.  Patient lives in a group home.  6.  Tobacco abuse: Patient quit smoking cigarillos and is now smoking a pipe about 10 times a day.  Complete tobacco cessation advised.  He does intermittently wear a nicotine patch but not while smoking.  7.  Disposition: Follow-up in clinic in 1 month to reassess stability.  Nicolasa Ducking, NP 11/29/2016, 12:47 PM

## 2016-11-29 NOTE — Patient Instructions (Signed)
Medication Instructions:  Your physician has recommended you make the following change in your medication:  1- FINISH your current prescription of Brilinta, then stop taking. 2- START Plavix 75 mg (1 tablet) by mouth once a day once you stop the Brilinta.   Labwork: Your physician recommends that you return for lab work at your next appointment. (LIPID, LIVER). - You will need to be fasting the morning of your appointment for the lab work.   Testing/Procedures: none  Follow-Up: Your physician recommends that you schedule a follow-up appointment in: 2 MONTHS WITH CHRIS OR DR ARIDA. IN THE MORNING SO HE CAN HAVE FASTING LABS., PLEASE.    If you need a refill on your cardiac medications before your next appointment, please call your pharmacy.

## 2016-11-30 NOTE — Addendum Note (Signed)
Addended by: Margrett RudSLAYTON, Lyndal Alamillo N on: 11/30/2016 11:11 AM   Modules accepted: Orders

## 2017-02-07 ENCOUNTER — Ambulatory Visit: Payer: Medicare Other | Admitting: Nurse Practitioner

## 2017-02-08 ENCOUNTER — Encounter: Payer: Self-pay | Admitting: Nurse Practitioner

## 2017-02-09 ENCOUNTER — Telehealth: Payer: Self-pay | Admitting: Cardiovascular Disease

## 2017-02-09 ENCOUNTER — Other Ambulatory Visit: Payer: Self-pay

## 2017-02-09 MED ORDER — ESOMEPRAZOLE MAGNESIUM 40 MG PO CPDR: 40.0000 mg | DELAYED_RELEASE_CAPSULE | Freq: Two times a day (BID) | ORAL | 0 refills | Status: DC

## 2017-02-09 NOTE — Telephone Encounter (Signed)
Campbell StallSteven Enoch (pt care taker) calling having questions on Nexium 40 mg He took this before going into hospital  On discharge paper it says to take that twice a day   They never got a refill sent in for this  Please send to [facility] in [place]

## 2017-02-09 NOTE — Telephone Encounter (Signed)
Requested Prescriptions   Signed Prescriptions Disp Refills  . esomeprazole (NEXIUM) 40 MG capsule 180 capsule 0    Sig: Take 1 capsule (40 mg total) by mouth 2 (two) times daily before a meal.    Authorizing Provider: Nicolasa DuckingBERGE, CHRISTOPHER RONALD    Ordering User: Margrett RudSLAYTON, Anhad Sheeley N

## 2017-02-09 NOTE — Telephone Encounter (Signed)
Patient's caregiver calling back. He wanted to clarify patient's nexium. I advised that on discharge from hospital on 11/01/16, patient should be taking BID and this was also noted at OV on 11/29/16. Advised he would need to f/u with patient's PCP for further refills. He verbalized understanding. Patient also overdue for 2 month f/u.  Patient scheduled to see Ward Givenshris Berge, NP on 02/22/17.  It appears our office sent in a Rx for nexium today.  I called [facility] and they do not have record of this.  Patient's prescriptions are handled separately, still at [facility], as he is a member of a group home. They have Rx for nexium on record from Dr Manchini's office with refills.  No further action needed at this time.

## 2017-02-09 NOTE — Telephone Encounter (Signed)
Refilled Medication. Looks like per last office visit with Ward Givenshris Berge, NP patient was to be taking BID. I do not think patient is aware that he is to take it BID. Please advise.   Requested Prescriptions   Signed Prescriptions Disp Refills  . esomeprazole (NEXIUM) 40 MG capsule 180 capsule 0    Sig: Take 1 capsule (40 mg total) by mouth 2 (two) times daily before a meal.    Authorizing Provider: Nicolasa DuckingBERGE, CHRISTOPHER RONALD    Ordering User: Margrett RudSLAYTON, Veasna Santibanez N

## 2017-02-09 NOTE — Telephone Encounter (Signed)
No answer. Left message to call back on patient's number.  No answer. Left message to call back on caregiver's number.  Patient was "no show" for 2 month f/u.

## 2017-02-22 ENCOUNTER — Ambulatory Visit: Payer: Medicare Other | Admitting: Nurse Practitioner

## 2017-02-23 ENCOUNTER — Encounter: Payer: Self-pay | Admitting: Nurse Practitioner

## 2017-11-13 ENCOUNTER — Telehealth: Payer: Self-pay | Admitting: Cardiovascular Disease

## 2017-11-13 NOTE — Telephone Encounter (Signed)
-----   Message from Festus Aloe, CMA sent at 11/10/2017  3:45 PM EDT ----- Please contact patient for a follow up appointment.  The patient requesting refills on meds.  Thanks, Jasmine December

## 2017-11-13 NOTE — Telephone Encounter (Signed)
Lmov for patient to call and schedule appointment ° °

## 2017-11-16 ENCOUNTER — Telehealth: Payer: Self-pay

## 2017-11-16 MED ORDER — CLOPIDOGREL BISULFATE 75 MG PO TABS: 75.0000 mg | ORAL_TABLET | Freq: Every day | ORAL | 0 refills | Status: DC

## 2017-11-16 NOTE — Telephone Encounter (Signed)
Patient instructed to make a follow up appointment with Ward Givens, NP for further refills.

## 2017-11-16 NOTE — Telephone Encounter (Signed)
Lmov for patient to call and schedule appointment ° °

## 2017-11-20 NOTE — Telephone Encounter (Signed)
Lmov for patient to call and schedule appointment ° °

## 2017-11-21 ENCOUNTER — Telehealth: Payer: Self-pay | Admitting: Nurse Practitioner

## 2017-11-21 NOTE — Telephone Encounter (Signed)
Phone note made in error ?

## 2017-11-21 NOTE — Telephone Encounter (Signed)
Sent Letter

## 2017-12-11 NOTE — Progress Notes (Signed)
Cardiology Office Note Date:  12/13/2017  Patient ID:  [name], DOB 05-Jul-1970, MRN [id number] PCP:  Center, [facility]  Cardiologist:  Dr. Kirke Corin, MD    Chief Complaint: Follow up  History of Present Illness: [name] is a 47 y.o. male with history of CAD with anterior STEMI in 10/2016 s/p PCI/DES to the LAD, HFrEF secondary to ICM, monomorphic VT, schizophrenia, HLD, noncompliance, and tobacco abuse who presents for follow up of his CAD and ICM.   Patient was admitted to the hospital in 10/2016 with an anterior STEMI. Emergent LHC showed a thrombotic subtotal occlusion of the proximal LAD and otherwise nonobstructive disease. He underwent successful PCI/DES to the proximal LAD. EF was 35-40% by echo. Post-procedure, he was noted to have symptomatic monomorphic VT. He was seen by EP with recommendation for a LifeVest and EPS. The patient refused both of these recommendations. Troponin peaked at 2.86. He was last seen in the office on 11/29/2016, and was doing reasonably well. He was not interested in cardiac rehab. He was transitioned from Brilinta to Plavix following his first month of Brilinta. There was not room in his BP to escalate heart failure therapy. Echo was recommended in early 2019, though not completed. Follow up appointment in 1 month was advised, though not completed.   Labs: 10/2016 - WBC 13.6, HGB 14.7, Mg++ 2.3, K+ 3.9, SCr 0.94, TSH normal, A1c 5.6, LDL 77, AST/ALT normal  He comes in doing well today.  He has not had any symptoms concerning for angina since he was last seen.  He does note chronic, stable shortness of breath which she attributes to ongoing tobacco abuse.  Unfortunately, he is uncertain of what medications.  In particular, he is unclear if he has been taking dual antiplatelet therapy with aspirin and Plavix.  He does not think he has been taking both Coreg and enalapril.  He also is uncertain if he has been taking Lipitor.  He continues to  smoke 1 pack daily and has done so since age [age].  He has no interest in quitting at this time.  He does note intermittent palpitations that have occurred 3-4 times since he was evaluated in 11/2016.  These palpitations are short-lived and typically last 4 to 5 seconds and spontaneously resolved.  He denies any associated symptoms with these palpitations.  His weight is up 13 pounds since he was last seen in 11/2016.  He attributes this to lots of [facility] as well as a poor diet in general.  He lives a fairly sedentary lifestyle.  He denies any lower extremity swelling, abdominal distention, orthopnea, or early satiety.  He continues to be uninterested in a cardiac rehab.  No dizziness, presyncope, or syncope.  With our office talking with [facility], it was reported his medications are shipped out each month and on time.  Past Medical History:  Diagnosis Date  . CAD (coronary artery disease)    a. 10/2016 Ant STEMI/PCI: LM nl, LAD 95 (3.0x19 Resolute Onyx DES), D1/2/3 nl, LCX min irregs, OM1/2/3/4 min irregs, RCA min irregs, EF 35-45%.  . Hypercholesteremia   . Ischemic cardiomyopathy    a. 10/2016 Echo: EF 35-40%, Severe mid-apicalanteroseptal, ant, antlat, apical HK. Gr1 DD. Mild MR.  . Monomorphic ventricular tachycardia (HCC)    a. 10/2016 following Ant MI/stenting-->refused LifeVest and EP study.  . Schizophrenia (HCC)   . Tobacco abuse     Past Surgical History:  Procedure Laterality Date  . CHOLECYSTECTOMY N/A 09/20/2014  Procedure: LAPAROSCOPIC CHOLECYSTECTOMY;  Surgeon: Natale Lay, MD;  Location: [facility] ORS;  Service: General;  Laterality: N/A;  . CORONARY/GRAFT ACUTE MI REVASCULARIZATION N/A 10/30/2016   Procedure: Coronary/Graft Acute MI Revascularization;  Surgeon: Iran Ouch, MD;  Location: [facility] INVASIVE CV LAB;  Service: Cardiovascular;  Laterality: N/A;  . ENDOSCOPIC RETROGRADE CHOLANGIOPANCREATOGRAPHY (ERCP) WITH PROPOFOL N/A 09/22/2014   Procedure:  ENDOSCOPIC RETROGRADE CHOLANGIOPANCREATOGRAPHY (ERCP) WITH PROPOFOL;  Surgeon: Wallace Cullens, MD;  Location: [facility] ENDOSCOPY;  Service: Endoscopy;  Laterality: N/A;  . ERCP N/A 09/25/2014   Procedure: ENDOSCOPIC RETROGRADE CHOLANGIOPANCREATOGRAPHY (ERCP);  Surgeon: Midge Minium, MD;  Location: [facility] ENDOSCOPY;  Service: Endoscopy;  Laterality: N/A;  . LEFT HEART CATH AND CORONARY ANGIOGRAPHY N/A 10/30/2016   Procedure: LEFT HEART CATH AND CORONARY ANGIOGRAPHY;  Surgeon: Iran Ouch, MD;  Location: [facility] INVASIVE CV LAB;  Service: Cardiovascular;  Laterality: N/A;    Current Meds  Medication Sig  . albuterol (PROVENTIL HFA;VENTOLIN HFA) 108 (90 BASE) MCG/ACT inhaler Inhale 2 puffs into the lungs every 4 (four) hours as needed for wheezing or shortness of breath.  Marland Kitchen aspirin 81 MG chewable tablet Chew 1 tablet (81 mg total) by mouth daily.  Marland Kitchen atorvastatin (LIPITOR) 40 MG tablet Take 40 mg by mouth every evening.   . benztropine (COGENTIN) 0.5 MG tablet Take 0.5 mg by mouth 2 (two) times daily.  . carvedilol (COREG) 6.25 MG tablet Take 1 tablet (6.25 mg total) by mouth 2 (two) times daily with a meal.  . clopidogrel (PLAVIX) 75 MG tablet Take 1 tablet (75 mg total) by mouth daily.  . enalapril (VASOTEC) 2.5 MG tablet Take 1 tablet (2.5 mg total) by mouth daily.  Marland Kitchen esomeprazole (NEXIUM) 40 MG capsule Take 1 capsule (40 mg total) by mouth 2 (two) times daily before a meal.  . fluticasone (FLOVENT HFA) 110 MCG/ACT inhaler Inhale 2 puffs into the lungs 2 (two) times daily.  Marland Kitchen gabapentin (NEURONTIN) 400 MG capsule Take 400 mg by mouth 3 (three) times daily.  . hydrOXYzine (VISTARIL) 50 MG capsule Take 50 mg by mouth 4 (four) times daily as needed for anxiety (agitation. Limit 4 caps per 24 hours.).  Marland Kitchen LORazepam (ATIVAN) 1 MG tablet Take 1 mg by mouth daily.   Marland Kitchen OLANZapine (ZYPREXA) 15 MG tablet Take 15 mg by mouth 2 (two) times daily.    Allergies:   Patient has no known allergies.   Social History:   The patient  reports that he has been smoking cigarettes. He has a 30.00 pack-year smoking history. He has never used smokeless tobacco. He reports that he does not drink alcohol or use drugs.   Family History:  The patient's family history includes Breast cancer in his mother; Heart attack in his mother; Heart murmur in his mother.  ROS:   Review of Systems  Constitutional: Positive for malaise/fatigue. Negative for chills, diaphoresis, fever and weight loss.  HENT: Negative for congestion.   Eyes: Negative for discharge and redness.  Respiratory: Negative for cough, hemoptysis, sputum production, shortness of breath and wheezing.   Cardiovascular: Positive for palpitations. Negative for chest pain, orthopnea, claudication, leg swelling and PND.  Gastrointestinal: Negative for abdominal pain, blood in stool, heartburn, melena, nausea and vomiting.  Genitourinary: Negative for hematuria.  Musculoskeletal: Negative for falls and myalgias.  Skin: Negative for rash.  Neurological: Negative for dizziness, tingling, tremors, sensory change, speech change, focal weakness, loss of consciousness and weakness.  Endo/Heme/Allergies: Does not bruise/bleed easily.  Psychiatric/Behavioral: Negative for substance  abuse. The patient is not nervous/anxious.   All other systems reviewed and are negative.    PHYSICAL EXAM:  VS:  BP 110/80 (BP Location: Left Arm, Patient Position: Sitting, Cuff Size: Normal)   Pulse 78   Ht 5\' 8"  (1.727 m)   Wt 160 lb 12 oz (72.9 kg)   BMI 24.44 kg/m  BMI: Body mass index is 24.44 kg/m.  Physical Exam  Constitutional: He is oriented to person, place, and time. He appears well-developed and well-nourished.  HENT:  Head: Normocephalic and atraumatic.  Eyes: Right eye exhibits no discharge. Left eye exhibits no discharge.  Neck: Normal range of motion. No JVD present.  Cardiovascular: Normal rate, regular rhythm, S1 normal, S2 normal and normal heart sounds. Exam  reveals no distant heart sounds, no friction rub, no midsystolic click and no opening snap.  No murmur heard. Pulses:      Posterior tibial pulses are 2+ on the right side, and 2+ on the left side.  Pulmonary/Chest: Effort normal and breath sounds normal. No respiratory distress. He has no decreased breath sounds. He has no wheezes. He has no rales. He exhibits no tenderness.  Abdominal: Soft. He exhibits no distension. There is no tenderness.  Musculoskeletal: He exhibits no edema.  Neurological: He is alert and oriented to person, place, and time.  Skin: Skin is warm and dry. No cyanosis. Nails show no clubbing.  Psychiatric: He has a normal mood and affect. His speech is normal and behavior is normal. Judgment and thought content normal.     EKG:  Was ordered and interpreted by me today. Shows NSR, 78 bpm, no acute st/t changes   Recent Labs: No results found for requested labs within last 8760 hours.  No results found for requested labs within last 8760 hours.   CrCl cannot be calculated (Patient's most recent lab result is older than the maximum 21 days allowed.).   Wt Readings from Last 3 Encounters:  12/13/17 160 lb 12 oz (72.9 kg)  11/29/16 147 lb 8 oz (66.9 kg)  10/30/16 151 lb 7.3 oz (68.7 kg)     Other studies reviewed: Additional studies/records reviewed today include: summarized above  ASSESSMENT AND PLAN:  1. CAD involving the native coronary arteries with history of anterior STEMI without angina: He is not having any symptoms concerning for angina at this time.  Unfortunately, the patient is uncertain if he is taking dual antiplatelet therapy at this time.  The pharmacy medication list was reviewed which indicates the patient has been having his Plavix filled.  The patient is unclear if he has been taking aspirin.  I have recommended that he take aspirin 81 mg daily indefinitely.  He will remain on Plavix 75 mg daily given he has undergone stenting to the LAD as above  just over 12 months prior.  He continues to be uninterested and cardiac rehab.  We have discussed secondary prevention and lifestyle modification in detail.  He continues to be uninterested in smoking cessation.  We have discussed heart healthy diet as well as limiting Mountain Dew's.  No plans for ischemic evaluation at this time.  2. HFrEF secondary to ICM: He does not appear grossly volume overloaded at this time.  His weight is up 13 pounds from when he was last seen however the patient and caretaker feel like this is secondary to poor diet and [employer].  Medication list was received from the patient's pharmacy and reviewed.  They have been routinely feeling all  of his medications including Plavix, Coreg, enalapril, and Lipitor.  We will continue him on Coreg 6.25 mg twice daily as well as enalapril 2.5 mg daily.  There is not room for addition of spironolactone given his relative hypotension; the same goes for [facility].  We will obtain a transthoracic echocardiogram to evaluate his EF.  CHF education was discussed in detail.  3. Palpitations: Recommended outpatient cardiac monitoring.  Patient refused.  4. Hyperlipidemia: It is unclear if the patient is taking atorvastatin.  LDL was noted to be 77 in 62/1308.  Check lipid panel and liver function today.  Goal LDL less than 70.  Continue Lipitor 80 mg daily.  5. Monomorphic VT: Recommended outpatient cardiac monitoring as above.  Patient refused.  6. Tobacco abuse: Complete cessation is advised.  He continues to intermittently wear a nicotine patch.  He is not interested in quitting at this time.  7. Schizophrenia: Stable on current medications.  Lives in [place] group home.  Disposition: F/u with Dr. Kirke Corin or an APP in 3 months.  Current medicines are reviewed at length with the patient today.  The patient did not have any concerns regarding medicines.  Signed, Eula Listen, PA-C 12/13/2017 9:26 AM     [facility] [place] [address] [place], [place] [address] [phone]

## 2017-12-13 ENCOUNTER — Ambulatory Visit (INDEPENDENT_AMBULATORY_CARE_PROVIDER_SITE_OTHER): Payer: Medicare Other | Admitting: Physician Assistant

## 2017-12-13 ENCOUNTER — Encounter: Payer: Self-pay | Admitting: Physician Assistant

## 2017-12-13 VITALS — BP 110/80 | HR 78 | Ht 68.0 in | Wt 160.8 lb

## 2017-12-13 DIAGNOSIS — I4729 Other ventricular tachycardia: Secondary | ICD-10-CM

## 2017-12-13 DIAGNOSIS — I255 Ischemic cardiomyopathy: Secondary | ICD-10-CM

## 2017-12-13 DIAGNOSIS — I252 Old myocardial infarction: Secondary | ICD-10-CM

## 2017-12-13 DIAGNOSIS — I2510 Atherosclerotic heart disease of native coronary artery without angina pectoris: Secondary | ICD-10-CM

## 2017-12-13 DIAGNOSIS — I472 Ventricular tachycardia: Secondary | ICD-10-CM | POA: Diagnosis not present

## 2017-12-13 DIAGNOSIS — E785 Hyperlipidemia, unspecified: Secondary | ICD-10-CM

## 2017-12-13 DIAGNOSIS — R002 Palpitations: Secondary | ICD-10-CM

## 2017-12-13 DIAGNOSIS — Z720 Tobacco use: Secondary | ICD-10-CM

## 2017-12-13 DIAGNOSIS — F209 Schizophrenia, unspecified: Secondary | ICD-10-CM

## 2017-12-13 MED ORDER — ASPIRIN EC 81 MG PO TBEC: DELAYED_RELEASE_TABLET | ORAL | 6 refills | Status: AC

## 2017-12-13 MED ORDER — ENALAPRIL MALEATE 2.5 MG PO TABS: 2.5000 mg | ORAL_TABLET | Freq: Every day | ORAL | 6 refills | Status: AC

## 2017-12-13 MED ORDER — ATORVASTATIN CALCIUM 80 MG PO TABS: ORAL_TABLET | ORAL | 6 refills | Status: AC

## 2017-12-13 MED ORDER — CARVEDILOL 6.25 MG PO TABS: 6.2500 mg | ORAL_TABLET | Freq: Two times a day (BID) | ORAL | 6 refills | Status: AC

## 2017-12-13 MED ORDER — CLOPIDOGREL BISULFATE 75 MG PO TABS: 75.0000 mg | ORAL_TABLET | Freq: Every day | ORAL | 6 refills | Status: AC

## 2017-12-13 NOTE — Patient Instructions (Addendum)
Medication Instructions:  - Your physician recommends that you continue on your current medications as directed. Please refer to the Current Medication list given to you today.  If you need a refill on your cardiac medications before your next appointment, please call your pharmacy.   Lab work: - Your physician recommends that you have lab work today: Lipid/ CMET/ CBC  If you have labs (blood work) drawn today and your tests are completely normal, you will receive your results only by: Marland Kitchen. MyChart Message (if you have MyChart) OR . A paper copy in the mail If you have any lab test that is abnormal or we need to change your treatment, we will call you to review the results.  Testing/Procedures: - Your physician has requested that you have an echocardiogram. Echocardiography is a painless test that uses sound waves to create images of your heart. It provides your doctor with information about the size and shape of your heart and how well your heart's chambers and valves are working. This procedure takes approximately one hour. There are no restrictions for this procedure.  Follow-Up: At [facility], you and your health needs are our priority.  As part of our continuing mission to provide you with exceptional heart care, we have created designated Provider Care Teams.  These Care Teams include your primary Cardiologist (physician) and Advanced Practice Providers (APPs -  Physician Assistants and Nurse Practitioners) who all work together to provide you with the care you need, when you need it. . in 3 months with Dr. Harlon FlorArida/ [name], PA  Any Other Special Instructions Will Be Listed Below (If Applicable). - N/A

## 2017-12-14 LAB — COMPREHENSIVE METABOLIC PANEL
A/G RATIO: 1.2 (ref 1.2–2.2)
ALT: 16 IU/L (ref 0–44)
AST: 22 IU/L (ref 0–40)
Albumin: 3.7 g/dL (ref 3.5–5.5)
Alkaline Phosphatase: 166 IU/L — ABNORMAL HIGH (ref 39–117)
BILIRUBIN TOTAL: 0.3 mg/dL (ref 0.0–1.2)
BUN / CREAT RATIO: 5 — AB (ref 9–20)
BUN: 4 mg/dL — ABNORMAL LOW (ref 6–24)
CHLORIDE: 100 mmol/L (ref 96–106)
CO2: 25 mmol/L (ref 20–29)
Calcium: 9.1 mg/dL (ref 8.7–10.2)
Creatinine, Ser: 0.88 mg/dL (ref 0.76–1.27)
GFR calc non Af Amer: 103 mL/min/{1.73_m2} (ref >59)
GFR, EST AFRICAN AMERICAN: 119 mL/min/{1.73_m2} (ref >59)
GLOBULIN, TOTAL: 3.2 g/dL (ref 1.5–4.5)
Glucose: 97 mg/dL (ref 65–99)
POTASSIUM: 4.1 mmol/L (ref 3.5–5.2)
SODIUM: 138 mmol/L (ref 134–144)
TOTAL PROTEIN: 6.9 g/dL (ref 6.0–8.5)

## 2017-12-14 LAB — LIPID PANEL
CHOL/HDL RATIO: 5.1 ratio — AB (ref 0.0–5.0)
CHOLESTEROL TOTAL: 147 mg/dL (ref 100–199)
HDL: 29 mg/dL — AB (ref >39)
LDL Calculated: 76 mg/dL (ref 0–99)
TRIGLYCERIDES: 210 mg/dL — AB (ref 0–149)
VLDL Cholesterol Cal: 42 mg/dL — ABNORMAL HIGH (ref 5–40)

## 2017-12-14 LAB — CBC WITH DIFFERENTIAL/PLATELET
BASOS ABS: 0.1 10*3/uL (ref 0.0–0.2)
BASOS: 1 %
EOS (ABSOLUTE): 0.2 10*3/uL (ref 0.0–0.4)
Eos: 2 %
Hematocrit: 38.7 % (ref 37.5–51.0)
Hemoglobin: 13.1 g/dL (ref 13.0–17.7)
Immature Grans (Abs): 0 10*3/uL (ref 0.0–0.1)
Immature Granulocytes: 0 %
LYMPHS ABS: 2.7 10*3/uL (ref 0.7–3.1)
LYMPHS: 20 %
MCH: 26.7 pg (ref 26.6–33.0)
MCHC: 33.9 g/dL (ref 31.5–35.7)
MCV: 79 fL (ref 79–97)
MONOS ABS: 0.6 10*3/uL (ref 0.1–0.9)
Monocytes: 5 %
NEUTROS ABS: 9.5 10*3/uL — AB (ref 1.4–7.0)
Neutrophils: 72 %
PLATELETS: 356 10*3/uL (ref 150–450)
RBC: 4.91 x10E6/uL (ref 4.14–5.80)
RDW: 14.6 % (ref 12.3–15.4)
WBC: 13.1 10*3/uL — ABNORMAL HIGH (ref 3.4–10.8)

## 2018-01-08 ENCOUNTER — Ambulatory Visit (INDEPENDENT_AMBULATORY_CARE_PROVIDER_SITE_OTHER): Payer: Medicare Other

## 2018-01-08 ENCOUNTER — Other Ambulatory Visit: Payer: Self-pay

## 2018-01-08 DIAGNOSIS — I255 Ischemic cardiomyopathy: Secondary | ICD-10-CM | POA: Diagnosis not present

## 2018-01-11 ENCOUNTER — Telehealth: Payer: Self-pay

## 2018-01-11 NOTE — Telephone Encounter (Signed)
-----   Message from Sondra Barges, PA-C sent at 01/09/2018  7:11 AM EST ----- Echo showed pump function of 50 to 55% with mild thickening of the heart muscle as well as a slightly stiffened heart.  Mildly leaky aortic valve with a mildly dilated a sending aorta. When compared to echo performed in 10/2016 EF is improved.  Continue current medications and plan to follow-up.  Reassuring study.

## 2018-01-11 NOTE — Telephone Encounter (Signed)
Call attempted, number unreachable.

## 2018-01-15 NOTE — Telephone Encounter (Signed)
I left a message for the patient to call back for results.  

## 2018-01-17 NOTE — Telephone Encounter (Signed)
Call to patient to discuss results from recent Echo.  Pt verbalized understanding and will f/u as planned on 2/28 with Eula Listen, PA.

## 2018-03-08 NOTE — Progress Notes (Deleted)
Cardiology Office Note Date:  03/08/2018  Patient ID:  [name], DOB [birth year], MRN [id number] PCP:  Center, [facility]  Cardiologist:  Dr. Fletcher Anon, MD  ***refresh   Chief Complaint: Follow up  History of Present Illness: [name] is a 48 y.o. male with history of CAD with anterior STEMI in 10/2016 s/p PCI/DES to the LAD, HFrEF secondary to ICM, monomorphic VT, schizophrenia, HLD, noncompliance, and tobacco abuse who presents for follow up of his CAD and ICM.   Patient was admitted to the hospital in 10/2016 with an anterior STEMI. Emergent LHC showed a thrombotic subtotal occlusion of the proximal LAD and otherwise nonobstructive disease. He underwent successful PCI/DES to the proximal LAD. EF was 35-40% by echo. Post-procedure, he was noted to have symptomatic monomorphic VT. He was seen by EP with recommendation for a LifeVest and EPS. The patient refused both of these recommendations. Troponin peaked at 2.86. He was last seen in the office on 11/29/2016, and was doing reasonably well. He was not interested in cardiac rehab. He was transitioned from Brilinta to Plavix following his first month of Brilinta. There was not room in his BP to escalate heart failure therapy. Echo was recommended in early 2019, though not completed. Follow up appointment in 1 month was advised, though not completed.   Labs: 12/2017 - WBC 13.1, HGB 13.1, K+ 4.1, SCr 0.88, alk phos 166, AST/ALT normal, LDL 76 10/2016 - Mg++ 2.3, TSH normal, A1c 5.6  He was last seen in the office in 12/2017, and was doing well.  He noted chronic stable SOB which he attributed to his tobacco abuse.  He was uncertain of which medications he was taking.  He continued to smoke 1 pack daily and had done so since age [age].  He was not interested in quitting.  He noted intermittent palpitations that he attributed to [facility] and poor diet.  He declined cardiac monitoring.  We called his pharmacy and verified that  his medications were shipped out to the patient monthly and on time.    ***  Past Medical History:  Diagnosis Date  . CAD (coronary artery disease)    a. 10/2016 Ant STEMI/PCI: LM nl, LAD 95 (3.0x19 Resolute Onyx DES), D1/2/3 nl, LCX min irregs, OM1/2/3/4 min irregs, RCA min irregs, EF 35-45%.  . Hypercholesteremia   . Ischemic cardiomyopathy    a. 10/2016 Echo: EF 35-40%, Severe mid-apicalanteroseptal, ant, antlat, apical HK. Gr1 DD. Mild MR.  . Monomorphic ventricular tachycardia ([place])    a. 10/2016 following Ant MI/stenting-->refused LifeVest and EP study.  . Schizophrenia ([place])   . Tobacco abuse     Past Surgical History:  Procedure Laterality Date  . CHOLECYSTECTOMY N/A 09/20/2014   Procedure: LAPAROSCOPIC CHOLECYSTECTOMY;  Surgeon: Sherri Rad, MD;  Location: [facility] ORS;  Service: General;  Laterality: N/A;  . CORONARY/GRAFT ACUTE MI REVASCULARIZATION N/A 10/30/2016   Procedure: Coronary/Graft Acute MI Revascularization;  Surgeon: Wellington Hampshire, MD;  Location: [place] CV LAB;  Service: Cardiovascular;  Laterality: N/A;  . ENDOSCOPIC RETROGRADE CHOLANGIOPANCREATOGRAPHY (ERCP) WITH PROPOFOL N/A 09/22/2014   Procedure: ENDOSCOPIC RETROGRADE CHOLANGIOPANCREATOGRAPHY (ERCP) WITH PROPOFOL;  Surgeon: Hulen Luster, MD;  Location: [facility] ENDOSCOPY;  Service: Endoscopy;  Laterality: N/A;  . ERCP N/A 09/25/2014   Procedure: ENDOSCOPIC RETROGRADE CHOLANGIOPANCREATOGRAPHY (ERCP);  Surgeon: Lucilla Lame, MD;  Location: [facility] ENDOSCOPY;  Service: Endoscopy;  Laterality: N/A;  . LEFT HEART CATH AND CORONARY ANGIOGRAPHY N/A 10/30/2016   Procedure: LEFT HEART CATH AND CORONARY ANGIOGRAPHY;  Surgeon: Wellington Hampshire, MD;  Location: [place] CV LAB;  Service: Cardiovascular;  Laterality: N/A;    No outpatient medications have been marked as taking for the 03/09/18 encounter (Appointment) with Rise Mu, PA-C.    Allergies:   Patient has no known allergies.   Social History:  The patient   reports that he has been smoking cigarettes. He has a 30.00 pack-year smoking history. He has never used smokeless tobacco. He reports that he does not drink alcohol or use drugs.   Family History:  The patient's family history includes Breast cancer in his mother; Heart attack in his mother; Heart murmur in his mother.  ROS:   ROS   PHYSICAL EXAM: *** VS:  There were no vitals taken for this visit. BMI: There is no height or weight on file to calculate BMI.  Physical Exam   EKG:  Was ordered and interpreted by me today. Shows ***  Recent Labs: 12/13/2017: ALT 16; BUN 4; Creatinine, Ser 0.88; Hemoglobin 13.1; Platelets 356; Potassium 4.1; Sodium 138  12/13/2017: Chol/HDL Ratio 5.1; Cholesterol, Total 147; HDL 29; LDL Calculated 76; Triglycerides 210   CrCl cannot be calculated (Patient's most recent lab result is older than the maximum 21 days allowed.).   Wt Readings from Last 3 Encounters:  12/13/17 160 lb 12 oz (72.9 kg)  11/29/16 147 lb 8 oz (66.9 kg)  10/30/16 151 lb 7.3 oz (68.7 kg)     Other studies reviewed: Additional studies/records reviewed today include: summarized above  ASSESSMENT AND PLAN:  1. ***  Disposition: F/u with Dr. Fletcher Anon or an APP in ***  Current medicines are reviewed at length with the patient today.  The patient did not have any concerns regarding medicines.  Signed, Christell Faith, PA-C 03/08/2018 7:20 AM     [place] [address] [place] [address] [place], [place] [address] [phone]

## 2018-03-09 ENCOUNTER — Ambulatory Visit: Payer: Medicare Other | Admitting: Physician Assistant

## 2018-07-11 ENCOUNTER — Other Ambulatory Visit: Payer: Self-pay | Admitting: Urology

## 2018-07-11 DIAGNOSIS — N200 Calculus of kidney: Secondary | ICD-10-CM

## 2018-08-22 ENCOUNTER — Ambulatory Visit
Admission: RE | Admit: 2018-08-22 | Discharge: 2018-08-22 | Disposition: A | Discharge status: Home / Self Care | Payer: Medicare Other | Source: Ambulatory Visit | Attending: Urology | Admitting: Urology

## 2018-08-22 ENCOUNTER — Ambulatory Visit (INDEPENDENT_AMBULATORY_CARE_PROVIDER_SITE_OTHER): Payer: Medicare Other | Admitting: Urology

## 2018-08-22 ENCOUNTER — Other Ambulatory Visit: Payer: Self-pay

## 2018-08-22 ENCOUNTER — Ambulatory Visit: Payer: Medicare Other | Admitting: Urology

## 2018-08-22 ENCOUNTER — Encounter: Payer: Self-pay | Admitting: Urology

## 2018-08-22 VITALS — BP 133/86 | HR 89 | Ht 68.0 in | Wt 161.8 lb

## 2018-08-22 DIAGNOSIS — N200 Calculus of kidney: Secondary | ICD-10-CM | POA: Insufficient documentation

## 2018-08-22 NOTE — Progress Notes (Signed)
08/22/2018 8:17 AM   [name] 1970-01-23 [id number]  Referring provider: Center, [facility] [address] [place],  [place] [address]  Chief Complaint  Patient presents with  . Nephrolithiasis    HPI:  48 year old male who presents today for further evaluation of nephrolithiasis.  Notably, the patient does have a legal guardian who resides in [place].  He was was to be accompanied today by a member of his group home however, he was unable to attend secondary to a funeral.  Despite this, the patient is able to ride a more than adequate history and case that he makes most medical decisions for himself.  He presents today for further evaluation of kidney stones.  He underwent a renal ultrasound on 05/15/2018 for unclear reasons.  This was returned at his primary care's office.  This indicates normal size kidneys bilaterally and 12.6 mm right lower pole stone as well as a 14 mm left kidney stone on ultrasound.  The patient denies a personal history of kidney stones.  Is never required kidney stone surgery.  He denies any history of flank pain whatsoever.  He denies any voiding symptoms.  No history of hematuria per his report.  KUB does indicate a 10 mm left lower pole stone.  No right-sided stone is appreciated.  The patient does bring a [facility] with him today.  He reports that he drinks at least 2 L of either [facility] or Pepsi every day.   PMH: Past Medical History:  Diagnosis Date  . CAD (coronary artery disease)    a. 10/2016 Ant STEMI/PCI: LM nl, LAD 95 (3.0x19 Resolute Onyx DES), D1/2/3 nl, LCX min irregs, OM1/2/3/4 min irregs, RCA min irregs, EF 35-45%.  . Hypercholesteremia   . Ischemic cardiomyopathy    a. 10/2016 Echo: EF 35-40%, Severe mid-apicalanteroseptal, ant, antlat, apical HK. Gr1 DD. Mild MR.  . Monomorphic ventricular tachycardia ([place])    a. 10/2016 following Ant MI/stenting-->refused LifeVest and EP study.  . Schizophrenia ([place])   .  Tobacco abuse     Surgical History: Past Surgical History:  Procedure Laterality Date  . CHOLECYSTECTOMY N/A 09/20/2014   Procedure: LAPAROSCOPIC CHOLECYSTECTOMY;  Surgeon: Sherri Rad, MD;  Location: [facility] ORS;  Service: General;  Laterality: N/A;  . CORONARY/GRAFT ACUTE MI REVASCULARIZATION N/A 10/30/2016   Procedure: Coronary/Graft Acute MI Revascularization;  Surgeon: Wellington Hampshire, MD;  Location: [place] CV LAB;  Service: Cardiovascular;  Laterality: N/A;  . ENDOSCOPIC RETROGRADE CHOLANGIOPANCREATOGRAPHY (ERCP) WITH PROPOFOL N/A 09/22/2014   Procedure: ENDOSCOPIC RETROGRADE CHOLANGIOPANCREATOGRAPHY (ERCP) WITH PROPOFOL;  Surgeon: Hulen Luster, MD;  Location: [facility] ENDOSCOPY;  Service: Endoscopy;  Laterality: N/A;  . ERCP N/A 09/25/2014   Procedure: ENDOSCOPIC RETROGRADE CHOLANGIOPANCREATOGRAPHY (ERCP);  Surgeon: Lucilla Lame, MD;  Location: [facility] ENDOSCOPY;  Service: Endoscopy;  Laterality: N/A;  . LEFT HEART CATH AND CORONARY ANGIOGRAPHY N/A 10/30/2016   Procedure: LEFT HEART CATH AND CORONARY ANGIOGRAPHY;  Surgeon: Wellington Hampshire, MD;  Location: [place] CV LAB;  Service: Cardiovascular;  Laterality: N/A;    Home Medications:  Allergies as of 08/22/2018   No Known Allergies     Medication List       Accurate as of August 22, 2018 11:59 PM. If you have any questions, ask your nurse or doctor.        albuterol 108 (90 Base) MCG/ACT inhaler Commonly known as: VENTOLIN HFA Inhale 2 puffs into the lungs every 4 (four) hours as needed for wheezing or shortness of breath.  aspirin EC 81 MG tablet Take 1 tablet (81 mg) by mouth once daily   atorvastatin 80 MG tablet Commonly known as: LIPITOR Take 1 tablet (80 mg) by mouth once daily   benztropine 0.5 MG tablet Commonly known as: COGENTIN Take 0.5 mg by mouth 2 (two) times daily.   carvedilol 6.25 MG tablet Commonly known as: COREG Take 1 tablet (6.25 mg total) by mouth 2 (two) times daily with a meal.    clopidogrel 75 MG tablet Commonly known as: PLAVIX Take 1 tablet (75 mg total) by mouth daily.   enalapril 2.5 MG tablet Commonly known as: VASOTEC Take 1 tablet (2.5 mg total) by mouth daily.   esomeprazole 40 MG capsule Commonly known as: NEXIUM Take 1 capsule (40 mg) by mouth once daily   fluticasone 110 MCG/ACT inhaler Commonly known as: FLOVENT HFA Inhale 2 puffs into the lungs 2 (two) times daily.   gabapentin 400 MG capsule Commonly known as: NEURONTIN Take 400 mg by mouth 3 (three) times daily.   hydrOXYzine 50 MG capsule Commonly known as: VISTARIL Take 50 mg by mouth 4 (four) times daily as needed for anxiety (agitation. Limit 4 caps per 24 hours.).   LORazepam 1 MG tablet Commonly known as: ATIVAN Take 1 mg by mouth daily.   OLANZapine 15 MG tablet Commonly known as: ZYPREXA Take 15 mg by mouth 2 (two) times daily.       Allergies: No Known Allergies  Family History: Family History  Problem Relation Age of Onset  . Heart murmur Mother   . Heart attack Mother   . Breast cancer Mother     Social History:  reports that he has been smoking cigarettes. He has a 30.00 pack-year smoking history. He has never used smokeless tobacco. He reports that he does not drink alcohol or use drugs.  ROS: UROLOGY Frequent Urination?: Yes Hard to postpone urination?: No Burning/pain with urination?: No Get up at night to urinate?: Yes Leakage of urine?: Yes Urine stream starts and stops?: Yes Trouble starting stream?: No Do you have to strain to urinate?: No Blood in urine?: No Urinary tract infection?: No Sexually transmitted disease?: No Injury to kidneys or bladder?: No Painful intercourse?: No Weak stream?: No Erection problems?: No Penile pain?: No  Gastrointestinal Nausea?: Yes Vomiting?: Yes Indigestion/heartburn?: No Diarrhea?: Yes Constipation?: Yes  Constitutional Fever: No Night sweats?: No Weight loss?: No Fatigue?: Yes  Skin Skin  rash/lesions?: No Itching?: Yes  Eyes Blurred vision?: Yes Double vision?: No  Ears/Nose/Throat Sore throat?: Yes Sinus problems?: Yes  Hematologic/Lymphatic Swollen glands?: No Easy bruising?: Yes  Cardiovascular Leg swelling?: No Chest pain?: Yes  Respiratory Cough?: Yes Shortness of breath?: Yes  Endocrine Excessive thirst?: Yes  Musculoskeletal Back pain?: Yes Joint pain?: Yes  Neurological Headaches?: Yes Dizziness?: Yes  Psychologic Depression?: Yes Anxiety?: Yes  Physical Exam: BP 133/86 (BP Location: Left Arm, Patient Position: Sitting, Cuff Size: Normal)   Pulse 89   Ht 5\' 8"  (1.727 m)   Wt 161 lb 12.8 oz (73.4 kg)   BMI 24.60 kg/m   Constitutional:  Alert and oriented, No acute distress. HEENT: [place] AT, moist mucus membranes.  Trachea midline, no masses. Cardiovascular: No clubbing, cyanosis, or edema. Respiratory: Normal respiratory effort, no increased work of breathing. GI: Abdomen is soft, nontender, nondistended, no abdominal masses GU: No CVA tenderness Skin: No rashes, bruises or suspicious lesions. Neurologic: Grossly intact, no focal deficits, moving all 4 extremities. Psychiatric: Normal mood and affect.  Laboratory  Data: Creatinine 0.9 on  05/15/18  Lab Results  Component Value Date   HGBA1C 5.6 10/31/2016    Pertinent Imaging: CLINICAL DATA:  Follow-up kidney stone  EXAM: ABDOMEN - 1 VIEW  COMPARISON:  None.  FINDINGS: Nonobstructive bowel gas pattern.  10 mm calculus overlying the left lower kidney.  Cholecystectomy clips.  Visualized osseous structures are within normal limits.  IMPRESSION: 10 mm left lower pole renal calculus.   Electronically Signed   By: Charline BillsSriyesh  Krishnan M.D.   On: 08/23/2018 02:50  KUB imaging was personally reviewed.  Agree with radiologic interpretation.  No right-sided stones are appreciated.  Assessment & Plan:    1. Kidney stones Incidental asymptomatic 10 mm left  lower pole stone He denies any symptoms today We discussed various options including continued surveillance, ESWL versus ureteroscopy Given he has no pain or discomfort from the stone, he prefer conservative management We will follow-up next year with a KUB to assess whether or not the stone is enlarged He will return in the interim if he has any flank pain gross hematuria or any other concerning symptoms He is agreeable this plan We discussed general stone prevention techniques including drinking plenty water with goal of producing 2.5 L urine daily, increased citric acid intake, avoidance of high oxalate containing foods, and decreased salt intake.  Information about dietary recommendations given today-specific recommendations to cut back on [facility] and Pepsi were strongly recommended  Findings and recommendations were written out for the patient to bring back to his group home given that he was unaccompanied today. - Urinalysis, Complete - Abdomen 1 view (KUB); Future  Return in about 1 year (around 08/22/2019) for KUB.  Vanna ScotlandAshley Doshie Maggi, MD  [facility] [address], [address] [place], [place] [address] [phone]

## 2018-08-22 NOTE — Patient Instructions (Signed)
This patient has asymptomatic kidney stones.  KUB was performed today which shows stones primarily on the left side which are nonobstructing.  After lengthy discussion, the patient would like to follow the stones.  We will plan to have him come back in 1 year with KUB to reassess.  He is to return if he has any flank pain or blood in his urine in the interim.  In addition to the above, he was advised to cut back on [place].  I have encouraged him to drink lemon lime sodas if he really needs to have soft drinks.  Information about preventing kidney stones was given.

## 2018-08-23 LAB — URINALYSIS, COMPLETE
Bilirubin, UA: NEGATIVE
Glucose, UA: NEGATIVE
Ketones, UA: NEGATIVE
Nitrite, UA: NEGATIVE
Protein,UA: NEGATIVE
Specific Gravity, UA: 1.01 (ref 1.005–1.030)
Urobilinogen, Ur: 0.2 mg/dL (ref 0.2–1.0)
pH, UA: 5.5 (ref 5.0–7.5)

## 2018-08-23 LAB — MICROSCOPIC EXAMINATION
Bacteria, UA: NONE SEEN
RBC: NONE SEEN /hpf (ref 0–2)

## 2019-08-26 NOTE — Progress Notes (Signed)
08/27/2019 10:14 AM   [name] He 1970/10/14 [id number]  Referring provider: Center, [name] [address] [place],  [place] [address] Chief Complaint  Patient presents with  . Nephrolithiasis    HPI: [name] is a 49 y.o. male returns today for a annual follow up of nephrolithiasis.   Notably, the patient did have a legal guardian who resides in [place].  He is fairly independent and makes most of his decisions by himself.  At last visit, he presented for further evaluation of kidney stones.  He underwent a renal ultrasound on 05/15/2018 for unclear reasons.  This was returned at his primary care's office.  This indicates normal size kidneys bilaterally and 12.6 mm right lower pole stone as well as a 14 mm left kidney stone on ultrasound.  He reported that he drank at least 2 L of either Dr [name] or Pepsi every day.  The patient denies a personal history of kidney stones.  Is never required kidney stone surgery.  He denies any history of flank pain whatsoever.  He denies any voiding symptoms.  No history of hematuria per his report.  KUB today shows a 12 mm stone in the left lower pole unchanged from previous imaging.   Today the patient has no complaints.  He said no interval stone episodes.  No flank pain or gross hematuria.   PMH: Past Medical History:  Diagnosis Date  . CAD (coronary artery disease)    a. 10/2016 Ant STEMI/PCI: LM nl, LAD 95 (3.0x19 Resolute Onyx DES), D1/2/3 nl, LCX min irregs, OM1/2/3/4 min irregs, RCA min irregs, EF 35-45%.  . Hypercholesteremia   . Ischemic cardiomyopathy    a. 10/2016 Echo: EF 35-40%, Severe mid-apicalanteroseptal, ant, antlat, apical HK. Gr1 DD. Mild MR.  . Monomorphic ventricular tachycardia ([facility])    a. 10/2016 following Ant MI/stenting-->refused LifeVest and EP study.  . Schizophrenia ([facility])   . Tobacco abuse     Surgical History: Past Surgical History:  Procedure Laterality Date  .  CHOLECYSTECTOMY N/A 09/20/2014   Procedure: LAPAROSCOPIC CHOLECYSTECTOMY;  Surgeon: Natale Lay, MD;  Location: [facility] ORS;  Service: General;  Laterality: N/A;  . CORONARY/GRAFT ACUTE MI REVASCULARIZATION N/A 10/30/2016   Procedure: Coronary/Graft Acute MI Revascularization;  Surgeon: Iran Ouch, MD;  Location: [facility] INVASIVE CV LAB;  Service: Cardiovascular;  Laterality: N/A;  . ENDOSCOPIC RETROGRADE CHOLANGIOPANCREATOGRAPHY (ERCP) WITH PROPOFOL N/A 09/22/2014   Procedure: ENDOSCOPIC RETROGRADE CHOLANGIOPANCREATOGRAPHY (ERCP) WITH PROPOFOL;  Surgeon: Wallace Cullens, MD;  Location: [facility] ENDOSCOPY;  Service: Endoscopy;  Laterality: N/A;  . ERCP N/A 09/25/2014   Procedure: ENDOSCOPIC RETROGRADE CHOLANGIOPANCREATOGRAPHY (ERCP);  Surgeon: Midge Minium, MD;  Location: [name] ENDOSCOPY;  Service: Endoscopy;  Laterality: N/A;  . LEFT HEART CATH AND CORONARY ANGIOGRAPHY N/A 10/30/2016   Procedure: LEFT HEART CATH AND CORONARY ANGIOGRAPHY;  Surgeon: Iran Ouch, MD;  Location: [facility] INVASIVE CV LAB;  Service: Cardiovascular;  Laterality: N/A;    Home Medications:  Allergies as of 08/27/2019   No Known Allergies     Medication List       Accurate as of August 27, 2019 10:14 AM. If you have any questions, ask your nurse or doctor.        albuterol 108 (90 Base) MCG/ACT inhaler Commonly known as: VENTOLIN HFA Inhale 2 puffs into the lungs every 4 (four) hours as needed for wheezing or shortness of breath.   aspirin EC 81 MG tablet Take 1 tablet (81 mg) by mouth once daily  atorvastatin 80 MG tablet Commonly known as: LIPITOR Take 1 tablet (80 mg) by mouth once daily   benztropine 0.5 MG tablet Commonly known as: COGENTIN Take 0.5 mg by mouth 2 (two) times daily.   carvedilol 6.25 MG tablet Commonly known as: COREG Take 1 tablet (6.25 mg total) by mouth 2 (two) times daily with a meal.   clopidogrel 75 MG tablet Commonly known as: PLAVIX Take 1 tablet (75 mg total) by mouth daily.     enalapril 2.5 MG tablet Commonly known as: VASOTEC Take 1 tablet (2.5 mg total) by mouth daily.   esomeprazole 40 MG capsule Commonly known as: NEXIUM Take 1 capsule (40 mg) by mouth once daily   fluticasone 110 MCG/ACT inhaler Commonly known as: FLOVENT HFA Inhale 2 puffs into the lungs 2 (two) times daily.   gabapentin 400 MG capsule Commonly known as: NEURONTIN Take 400 mg by mouth 3 (three) times daily.   hydrOXYzine 50 MG capsule Commonly known as: VISTARIL Take 50 mg by mouth 4 (four) times daily as needed for anxiety (agitation. Limit 4 caps per 24 hours.).   LORazepam 1 MG tablet Commonly known as: ATIVAN Take 1 mg by mouth daily.   OLANZapine 15 MG tablet Commonly known as: ZYPREXA Take 15 mg by mouth 2 (two) times daily.       Allergies: No Known Allergies  Family History: Family History  Problem Relation Age of Onset  . Heart murmur Mother   . Heart attack Mother   . Breast cancer Mother     Social History:  reports that he has been smoking cigarettes. He has a 30.00 pack-year smoking history. He has never used smokeless tobacco. He reports that he does not drink alcohol and does not use drugs.   Physical Exam: BP 109/74   Pulse 81   Constitutional:  Alert and oriented, No acute distress. HEENT: [place] AT, moist mucus membranes.  Trachea midline, no masses. Cardiovascular: No clubbing, cyanosis, or edema. Respiratory: Normal respiratory effort, no increased work of breathing. Skin: No rashes, bruises or suspicious lesions. Neurologic: Grossly intact, no focal deficits, moving all 4 extremities. Psychiatric: Normal mood and affect.   Pertinent Imaging: KUB was personally reviewed, stable from last year as above  Assessment & Plan:    1. Nephrolithiasis  KUB is stable. Follow up with KUB in 2 years unless he becomes symptomatic Reviewed dietary recommendations for stone diet Patient agreed.  [facility] [address], [address] [place], [place] [address] [phone]  I, Theador Hawthorne, am acting as a scribe for Dr. Vanna Scotland.  I have reviewed the above documentation for accuracy and completeness, and I agree with the above.   Vanna Scotland, MD

## 2019-08-27 ENCOUNTER — Other Ambulatory Visit: Payer: Self-pay

## 2019-08-27 ENCOUNTER — Encounter: Payer: Self-pay | Admitting: Urology

## 2019-08-27 ENCOUNTER — Ambulatory Visit
Admission: RE | Admit: 2019-08-27 | Discharge: 2019-08-27 | Disposition: A | Discharge status: Home / Self Care | Payer: Medicare Other | Attending: Urology | Admitting: Urology

## 2019-08-27 ENCOUNTER — Ambulatory Visit
Admission: RE | Admit: 2019-08-27 | Discharge: 2019-08-27 | Disposition: A | Discharge status: Home / Self Care | Payer: Medicare Other | Source: Ambulatory Visit | Attending: Urology | Admitting: Urology

## 2019-08-27 ENCOUNTER — Ambulatory Visit (INDEPENDENT_AMBULATORY_CARE_PROVIDER_SITE_OTHER): Payer: Medicare Other | Admitting: Urology

## 2019-08-27 VITALS — BP 109/74 | HR 81

## 2019-08-27 DIAGNOSIS — N200 Calculus of kidney: Secondary | ICD-10-CM

## 2021-01-13 IMAGING — CR ABDOMEN - 1 VIEW
2 series · 2 of 2 positions shown · non-contrast
Comparison: None.

CLINICAL DATA: Follow-up kidney stone

EXAM:
ABDOMEN - 1 VIEW

[abdomen kub (1 of 2)]
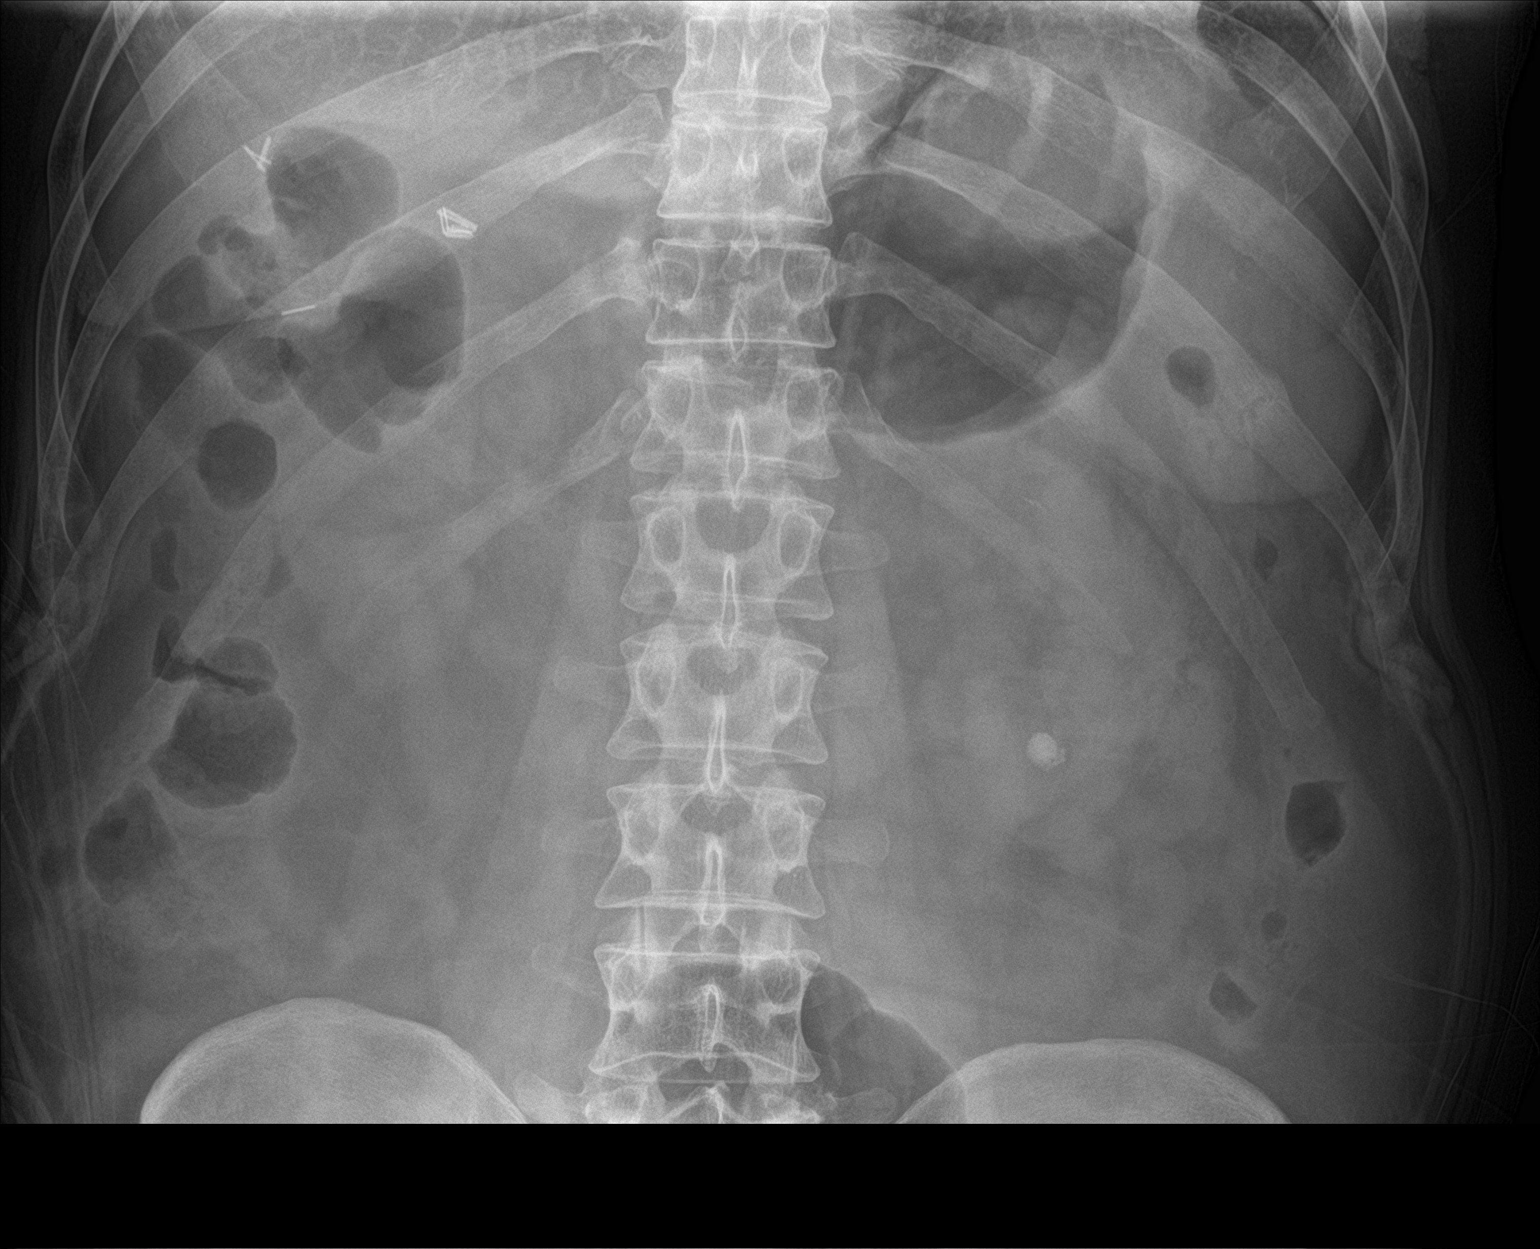

[abdomen kub (2 of 2)]
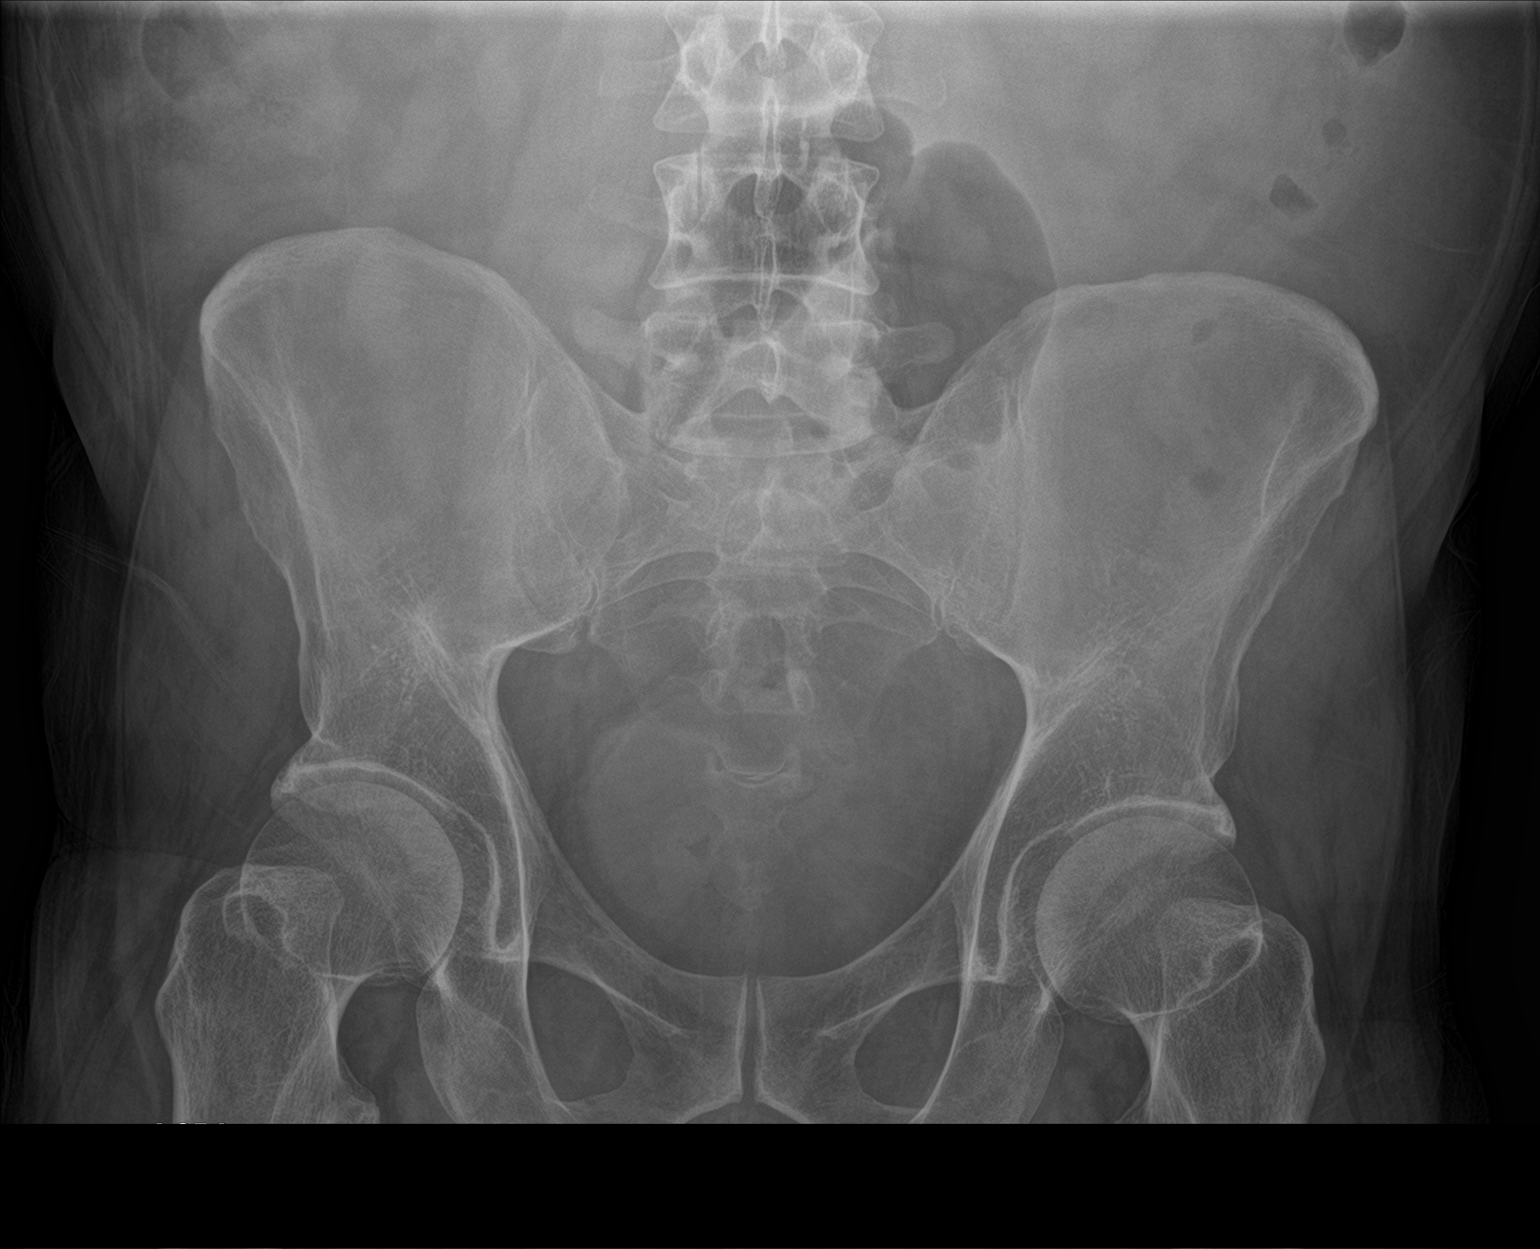

[2 of 2 positions shown; findings below may reference images not displayed]

FINDINGS: Nonobstructive bowel gas pattern.

10 mm calculus overlying the left lower kidney.

Cholecystectomy clips.

Visualized osseous structures are within normal limits.
IMPRESSION: 10 mm left lower pole renal calculus.

## 2022-01-18 IMAGING — CR DG ABDOMEN 1V
1 series · 2 of 2 positions shown · non-contrast
Comparison: 08/22/2018 abdominal radiograph. 09/19/2014 MRI
abdomen.

CLINICAL DATA: Kidney stones

EXAM:
ABDOMEN - 1 VIEW

[Series 1: dg abd 1 view · 0.14mm/px · 2 of 2 slices shown]
[im 1/2]
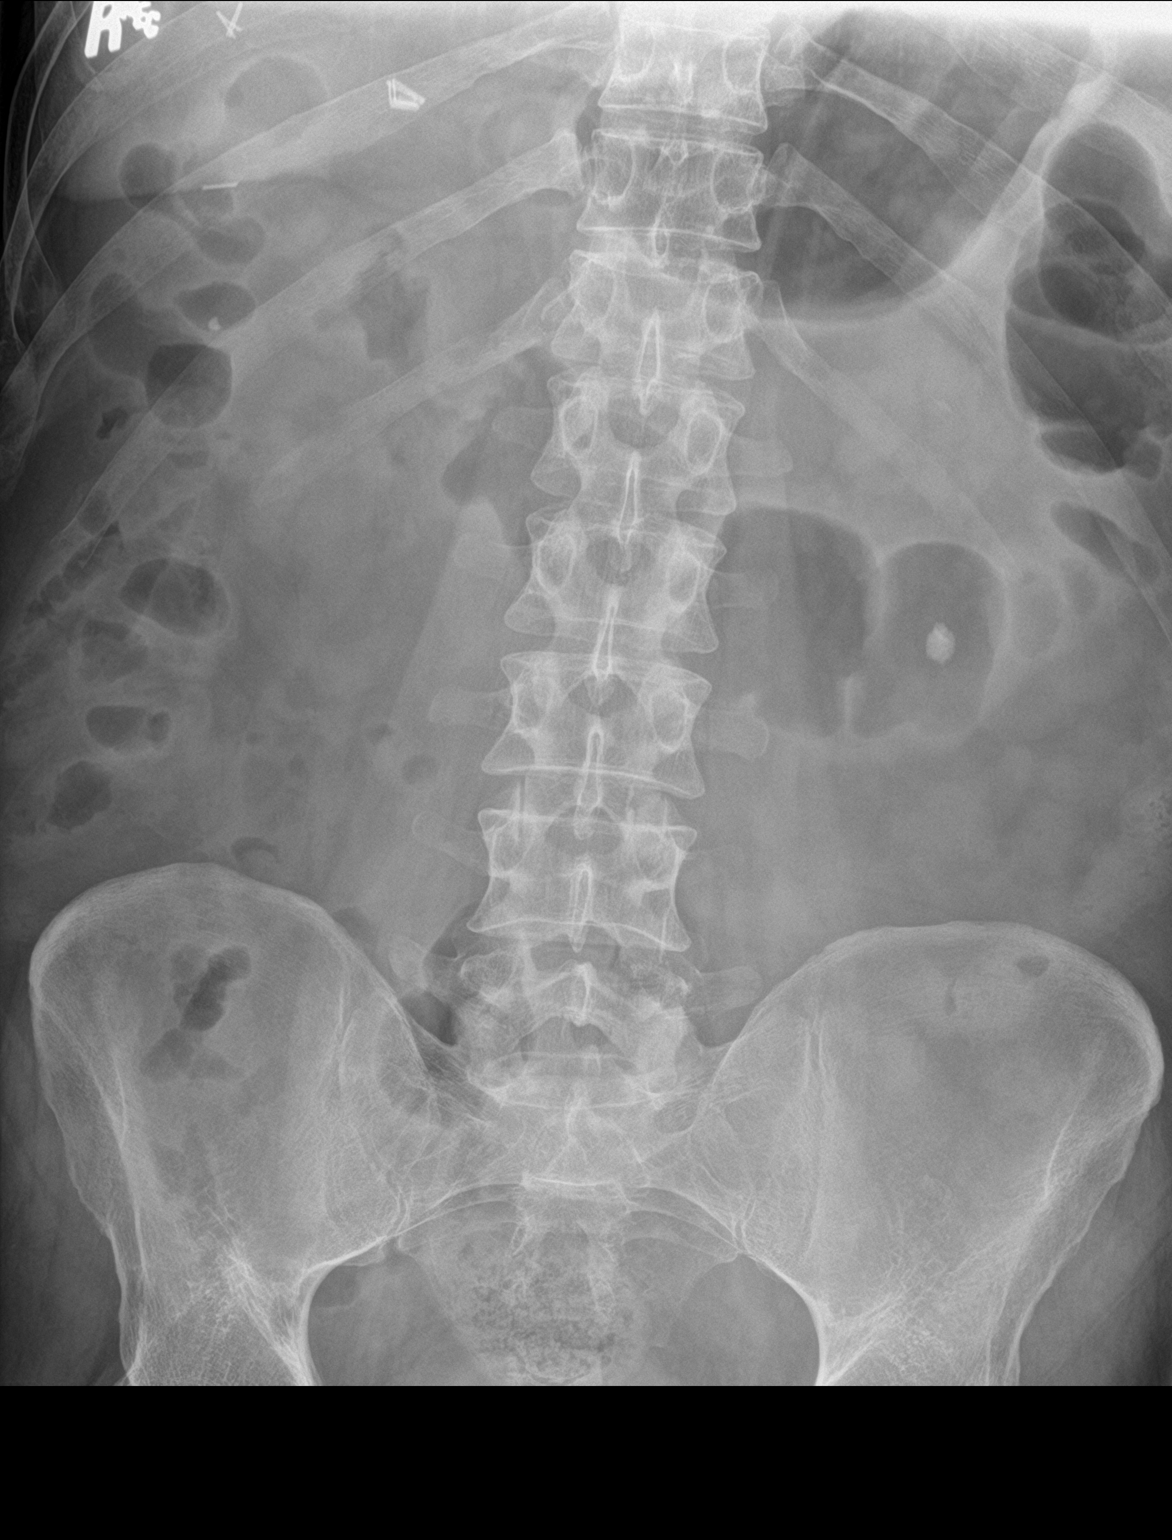
[im 2/2]
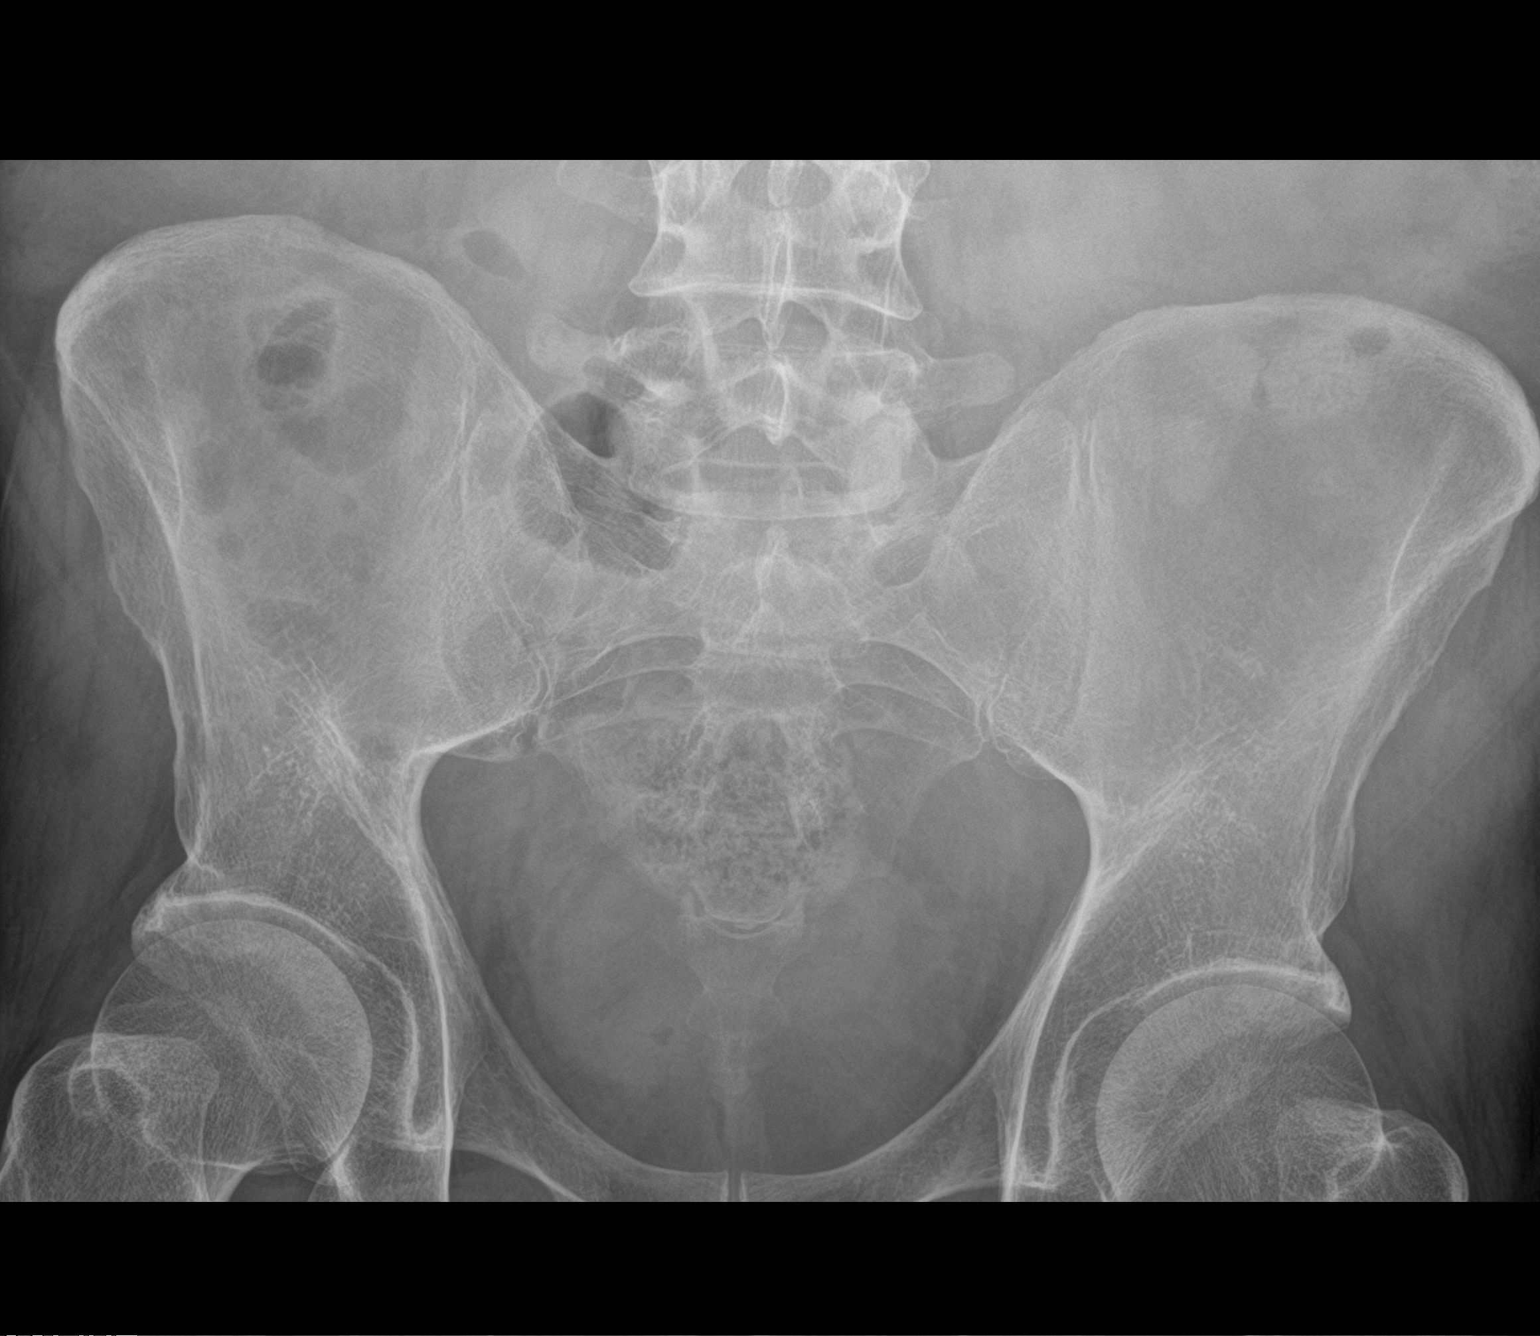

[2 of 2 positions shown; findings below may reference images not displayed]

FINDINGS: 1.0 cm calcific density overlying the left inferior renal shadow is
unchanged. New 0.3 cm calcific density overlying the lateral right
interpole.

No calcific densities along the expected course of the ureters.
Nonobstructive bowel gas pattern. No acute osseous abnormality.
Right upper quadrant surgical clips.
IMPRESSION: 1.0 cm left inferior pole calculus is unchanged.

New 3 mm right interpole calcific density.

## 2022-03-30 ENCOUNTER — Other Ambulatory Visit: Payer: Self-pay

## 2022-03-30 ENCOUNTER — Emergency Department
Admission: EM | Admit: 2022-03-30 | Discharge: 2022-03-31 | Disposition: A | Discharge status: Home / Self Care | Payer: Medicare Other | Attending: Emergency Medicine | Admitting: Emergency Medicine

## 2022-03-30 ENCOUNTER — Emergency Department: Payer: Medicare Other

## 2022-03-30 ENCOUNTER — Encounter: Payer: Self-pay | Admitting: Emergency Medicine

## 2022-03-30 DIAGNOSIS — E876 Hypokalemia: Secondary | ICD-10-CM

## 2022-03-30 DIAGNOSIS — R531 Weakness: Secondary | ICD-10-CM | POA: Diagnosis present

## 2022-03-30 DIAGNOSIS — I10 Essential (primary) hypertension: Secondary | ICD-10-CM | POA: Insufficient documentation

## 2022-03-30 HISTORY — DX: Essential (primary) hypertension: I10

## 2022-03-30 LAB — URINALYSIS, ROUTINE W REFLEX MICROSCOPIC
Bacteria, UA: NONE SEEN
Bilirubin Urine: NEGATIVE
Glucose, UA: >=500 mg/dL — AB
Hgb urine dipstick: NEGATIVE
Ketones, ur: NEGATIVE mg/dL
Leukocytes,Ua: NEGATIVE
Nitrite: NEGATIVE
Protein, ur: NEGATIVE mg/dL
Specific Gravity, Urine: 1.009 (ref 1.005–1.030)
pH: 6 (ref 5.0–8.0)

## 2022-03-30 LAB — CBC
HCT: 40.9 % (ref 39.0–52.0)
Hemoglobin: 13.7 g/dL (ref 13.0–17.0)
MCH: 29.2 pg (ref 26.0–34.0)
MCHC: 33.5 g/dL (ref 30.0–36.0)
MCV: 87.2 fL (ref 80.0–100.0)
Platelets: 302 10*3/uL (ref 150–400)
RBC: 4.69 MIL/uL (ref 4.22–5.81)
RDW: 17 % — ABNORMAL HIGH (ref 11.5–15.5)
WBC: 11.4 10*3/uL — ABNORMAL HIGH (ref 4.0–10.5)
nRBC: 0 % (ref 0.0–0.2)

## 2022-03-30 LAB — BASIC METABOLIC PANEL
Anion gap: 8 (ref 5–15)
BUN: <5 mg/dL — ABNORMAL LOW (ref 6–20)
CO2: 29 mmol/L (ref 22–32)
Calcium: 8.2 mg/dL — ABNORMAL LOW (ref 8.9–10.3)
Chloride: 101 mmol/L (ref 98–111)
Creatinine, Ser: 0.76 mg/dL (ref 0.61–1.24)
GFR, Estimated: >60 mL/min (ref >60)
Glucose, Bld: 89 mg/dL (ref 70–99)
Potassium: 2.1 mmol/L — CL (ref 3.5–5.1)
Sodium: 138 mmol/L (ref 135–145)

## 2022-03-30 LAB — POTASSIUM: Potassium: 2.6 mmol/L — CL (ref 3.5–5.1)

## 2022-03-30 LAB — MAGNESIUM: Magnesium: 1.7 mg/dL (ref 1.7–2.4)

## 2022-03-30 LAB — CK: Total CK: 55 U/L (ref 49–397)

## 2022-03-30 MED ORDER — POTASSIUM CHLORIDE CRYS ER 20 MEQ PO TBCR
40.0000 meq | EXTENDED_RELEASE_TABLET | Freq: Once | ORAL | Status: AC
  Administered 2022-03-30: 40 meq via ORAL
  Filled 2022-03-30: qty 2

## 2022-03-30 MED ORDER — SODIUM CHLORIDE 0.9 % IV BOLUS
1000.0000 mL | Freq: Once | INTRAVENOUS | Status: AC
  Administered 2022-03-30: 1000 mL via INTRAVENOUS

## 2022-03-30 MED ORDER — POTASSIUM CHLORIDE 10 MEQ/100ML IV SOLN
10.0000 meq | INTRAVENOUS | Status: AC
  Administered 2022-03-30 (×4): 10 meq via INTRAVENOUS
  Filled 2022-03-30 (×4): qty 100

## 2022-03-30 MED ORDER — POTASSIUM CHLORIDE 10 MEQ/100ML IV SOLN
10.0000 meq | INTRAVENOUS | Status: AC
  Administered 2022-03-30 (×2): 10 meq via INTRAVENOUS
  Filled 2022-03-30 (×3): qty 100

## 2022-03-30 NOTE — ED Provider Notes (Addendum)
[facility] Provider Note    Event Date/Time   First MD Initiated Contact with Patient 03/30/22 1814     (approximate)   History   Weakness   HPI  [name] is a 52 y.o. male   Past medical history of CAD, hypertension, hyperlipidemia, schizophrenia who presents the emergency department with generalized weakness.  Has had very poor p.o. intake for the last several weeks due to loss of appetite.  He denies any recent illnesses however or recent trauma but states that he had an episode of generalized weakness where his legs gave out.  He says that he was standing up and masturbating at the time.  He denies any injuries from his fall.  He denies any focal infectious symptoms like cough, fever, chills, nausea vomiting diarrhea, or dysuria.  Denies chest pain shortness of breath.  He is currently asymptomatic sitting in the bed.   External Medical Documents Reviewed: Cardiology office visit from March 2023 documenting his CAD and hypertension history and medical management.      Physical Exam   Triage Vital Signs: ED Triage Vitals  Enc Vitals Group     BP 03/30/22 1652 (!) 120/97     Pulse Rate 03/30/22 1652 81     Resp 03/30/22 1652 18     Temp 03/30/22 1652 98.5 F (36.9 C)     Temp Source 03/30/22 1652 Oral     SpO2 03/30/22 1652 95 %     Weight 03/30/22 1653 145 lb (65.8 kg)     Height 03/30/22 1653 5\' 8"  (1.727 m)     Head Circumference --      Peak Flow --      Pain Score 03/30/22 1659 0     Pain Loc --      Pain Edu? --      Excl. in [place]? --     Most recent vital signs: Vitals:   03/30/22 1830 03/30/22 1913  BP: 123/83 (!) 129/95  Pulse: 72 82  Resp: 16 16  Temp:  97.8 F (36.6 C)  SpO2: 99% 96%    General: Awake, no distress.  CV:  Good peripheral perfusion.  Resp:  Normal effort.  Abd:  No distention.  Other:  Awake alert pleasant comfortable appearing laying in the stretcher with normal hemodynamics.  Abdomen soft  and nontender skin appears warm well-perfused.  Motor or sensory exam intact equal and intact in all extremities.  There is no tenderness palpation of his upper or lower extremities.  His lungs are clear to auscultation.   ED Results / Procedures / Treatments   Labs (all labs ordered are listed, but only abnormal results are displayed) Labs Reviewed  BASIC METABOLIC PANEL - Abnormal; Notable for the following components:      Result Value   Potassium 2.1 (*)    BUN <5 (*)    Calcium 8.2 (*)    All other components within normal limits  CBC - Abnormal; Notable for the following components:   WBC 11.4 (*)    RDW 17.0 (*)    All other components within normal limits  URINALYSIS, ROUTINE W REFLEX MICROSCOPIC - Abnormal; Notable for the following components:   Color, Urine YELLOW (*)    APPearance CLEAR (*)    Glucose, UA >=500 (*)    All other components within normal limits  POTASSIUM - Abnormal; Notable for the following components:   Potassium 2.6 (*)    All other components  within normal limits  MAGNESIUM  CK  CBG MONITORING, ED     I ordered and reviewed the above labs they are notable for his potassium is markedly low at 2.1 and a white blood cell count slightly elevated 11.4.  Magnesium normal.  EKG  ED ECG REPORT I, Lucillie Garfinkel, the attending physician, personally viewed and interpreted this ECG.   Date: 03/30/2022  EKG Time: 1700  Rate: 79  Rhythm: NSR  Axis: nl  Intervals:none  ST&T Change: No acute ischemic changes   PROCEDURES:  Critical Care performed: No  Procedures   MEDICATIONS ORDERED IN ED: Medications  potassium chloride 10 mEq in 100 mL IVPB (10 mEq Intravenous New Bag/Given 03/30/22 2128)  potassium chloride 10 mEq in 100 mL IVPB (has no administration in time range)  potassium chloride SA (KLOR-CON M) CR tablet 40 mEq (has no administration in time range)  potassium chloride SA (KLOR-CON M) CR tablet 40 mEq (40 mEq Oral Given 03/30/22 1834)   sodium chloride 0.9 % bolus 1,000 mL (1,000 mLs Intravenous New Bag/Given 03/30/22 1833)  potassium chloride SA (KLOR-CON M) CR tablet 40 mEq (40 mEq Oral Given 03/30/22 1915)     IMPRESSION / MDM / ASSESSMENT AND PLAN / ED COURSE  I reviewed the triage vital signs and the nursing notes.                                Patient's presentation is most consistent with acute presentation with potential threat to life or bodily function.  Differential diagnosis includes, but is not limited to, dehydration, electrolyte disturbance, CVA, dysrhythmia, ACS, vasovagal, orthostatic syncope/presyncope   The patient is on the cardiac monitor to evaluate for evidence of arrhythmia and/or significant heart rate changes.  MDM: This patient had generalized weakness episode in the setting of poor p.o. intake and has electrolyte abnormality severe hypokalemia with no EKG changes.  His exam is benign he appears comfortable with normal hemodynamics now and has no other acute medical complaints.  Will give fluids and potassium repletion.  Check the potassium in the middle of his repletion is improving as expected now 2.6. Will give further potassium repletion with more IV and p.o. and after it is finished his potassium should be an expected normalized or near normal level.  Reporting right wrist pain after bracing himself from feeling weak earlier, no snuffbox tenderness, distal ulnar tenderness, negative x-ray.  I considered hospitalization for admission or observation however given asymptomatic in the emergency department with identified electrolyte abnormalities that are adequately repleted in the emergency department, I think outpatient follow-up and monitoring is most appropriate at this time.        FINAL CLINICAL IMPRESSION(S) / ED DIAGNOSES   Final diagnoses:  Generalized weakness  Hypokalemia     Rx / DC Orders   ED Discharge Orders     None        Note:  This document was  prepared using Dragon voice recognition software and may include unintentional dictation errors.    Lucillie Garfinkel, MD 03/30/22 2207    Lucillie Garfinkel, MD 03/30/22 [phone]

## 2022-03-30 NOTE — ED Triage Notes (Signed)
Patient to ED via [facility] from group home for bilateral leg and arm weakness that started today. Patient states he is unable to walk. NAD noted.  [facility]- call Jan at [phone] for discharge.

## 2022-03-30 NOTE — Discharge Instructions (Signed)
Your potassium level was very low which accounts for your weakness in your legs giving out.  We gave you some fluids as well as potassium to replenish and replace your levels today in the emergency department.  See the attached information to eat more foods that have potassium inside.  Follow-up with your primary doctor to recheck your blood levels including potassium.     Thank you for choosing [place] for your health care today!  Please see your primary doctor this week for a follow up appointment.   Sometimes, in the early stages of certain disease courses it is difficult to detect in the emergency department evaluation -- so, it is important that you continue to monitor your symptoms and call your doctor right away or return to the emergency department if you develop any new or worsening symptoms.  Please go to the following website to schedule new (and existing) patient appointments:   [email or web address]  If you do not have a primary doctor try calling the following clinics to establish care:  If you have insurance:  [facility] [phone] [place] [address]   [facility]  [phone] [place] [address] [place] [address]   If you do not have insurance:  [facility]  [phone] [address], [place] [address]   The following is another list of primary care offices in the area who are accepting new patients at this time.  Please reach out to one of them directly and let them know you would like to schedule an appointment to follow up on an Emergency Department visit, and/or to establish a new primary care provider (PCP).  There are likely other primary care clinics in the are who are accepting new patients, but this is an excellent place to start:  [place] physician: Dr Lavon Paganini [address] [place], [place]  [address] [phone]  [facility] Lead Physician: Dr Steele Sizer [address], [place], [place] [address] [phone]  [place] Physician: Dr Park Liter [address] [place], [place] [address] [phone]  [facility] Lead Physician: Dr Dewaine Oats [place], [place], [place] [address] [phone]  [place] Physician: Dr Halina Maidens [address] [place] Broach [place], [place] [address] [phone]   It was my pleasure to care for you today.   Hoover Brunette Jacelyn Grip, MD

## 2022-03-31 DIAGNOSIS — R531 Weakness: Secondary | ICD-10-CM | POA: Diagnosis not present

## 2022-03-31 MED ORDER — POTASSIUM CHLORIDE CRYS ER 20 MEQ PO TBCR
40.0000 meq | EXTENDED_RELEASE_TABLET | Freq: Once | ORAL | Status: AC
  Administered 2022-03-31: 40 meq via ORAL
  Filled 2022-03-31: qty 2

## 2022-03-31 NOTE — ED Notes (Signed)
[name] from [facility] contacted by Lorriane Shire, Agricultural consultant. [name] states they are sending [name] to pick up patient.

## 2022-03-31 NOTE — ED Notes (Signed)
Jan called at [phone] regarding discharge. No answer, voice message left.

## 2022-03-31 NOTE — ED Notes (Addendum)
Attempted to reach patient's legal guardian Hale Drone to notify of discharge. No answer.

## 2022-03-31 NOTE — ED Provider Notes (Signed)
-----------------------------------------   1:29 AM on 03/31/2022 -----------------------------------------   IV potassium infusion completed.  Patient voiced no complaints.  He was discharged in good and stable condition.   Paulette Blanch, MD 03/31/22 0130

## 2023-10-09 ENCOUNTER — Other Ambulatory Visit: Payer: Self-pay

## 2023-10-09 DIAGNOSIS — R109 Unspecified abdominal pain: Secondary | ICD-10-CM | POA: Insufficient documentation

## 2023-10-09 DIAGNOSIS — R456 Violent behavior: Secondary | ICD-10-CM | POA: Diagnosis present

## 2023-10-09 DIAGNOSIS — F209 Schizophrenia, unspecified: Secondary | ICD-10-CM | POA: Insufficient documentation

## 2023-10-09 DIAGNOSIS — F29 Unspecified psychosis not due to a substance or known physiological condition: Secondary | ICD-10-CM | POA: Diagnosis not present

## 2023-10-09 LAB — CBC
HCT: 41.7 % (ref 39.0–52.0)
Hemoglobin: 14.3 g/dL (ref 13.0–17.0)
MCH: 31.4 pg (ref 26.0–34.0)
MCHC: 34.3 g/dL (ref 30.0–36.0)
MCV: 91.6 fL (ref 80.0–100.0)
Platelets: 271 K/uL (ref 150–400)
RBC: 4.55 MIL/uL (ref 4.22–5.81)
RDW: 14.1 % (ref 11.5–15.5)
WBC: 11.5 K/uL — ABNORMAL HIGH (ref 4.0–10.5)
nRBC: 0 % (ref 0.0–0.2)

## 2023-10-09 LAB — URINALYSIS, ROUTINE W REFLEX MICROSCOPIC
Bacteria, UA: NONE SEEN
Bilirubin Urine: NEGATIVE
Glucose, UA: >=500 mg/dL — AB
Hgb urine dipstick: NEGATIVE
Ketones, ur: NEGATIVE mg/dL
Leukocytes,Ua: NEGATIVE
Nitrite: NEGATIVE
Protein, ur: NEGATIVE mg/dL
Specific Gravity, Urine: 1.008 (ref 1.005–1.030)
pH: 6 (ref 5.0–8.0)

## 2023-10-09 LAB — COMPREHENSIVE METABOLIC PANEL WITH GFR
ALT: 17 U/L (ref 0–44)
AST: 23 U/L (ref 15–41)
Albumin: 3.4 g/dL — ABNORMAL LOW (ref 3.5–5.0)
Alkaline Phosphatase: 70 U/L (ref 38–126)
Anion gap: 11 (ref 5–15)
BUN: <5 mg/dL — ABNORMAL LOW (ref 6–20)
CO2: 25 mmol/L (ref 22–32)
Calcium: 9.1 mg/dL (ref 8.9–10.3)
Chloride: 101 mmol/L (ref 98–111)
Creatinine, Ser: 0.93 mg/dL (ref 0.61–1.24)
GFR, Estimated: >60 mL/min (ref >60)
Glucose, Bld: 96 mg/dL (ref 70–99)
Potassium: 3 mmol/L — ABNORMAL LOW (ref 3.5–5.1)
Sodium: 137 mmol/L (ref 135–145)
Total Bilirubin: 0.8 mg/dL (ref 0.0–1.2)
Total Protein: 7.3 g/dL (ref 6.5–8.1)

## 2023-10-09 NOTE — ED Triage Notes (Signed)
 Pt to ED via EMS fro [facility] [phone]), pt reports abd aching and being unable to eat for the past few years

## 2023-10-09 NOTE — ED Triage Notes (Addendum)
 Pt arrives via EMS from [facility] [phone]) after throwing a table thru a window; pt is here for abd pain

## 2023-10-10 ENCOUNTER — Emergency Department
Admission: EM | Admit: 2023-10-10 | Discharge: 2023-10-10 | Disposition: A | Discharge status: Other Acute Inpatient Hospital | Attending: Emergency Medicine | Admitting: Emergency Medicine

## 2023-10-10 DIAGNOSIS — R451 Restlessness and agitation: Secondary | ICD-10-CM

## 2023-10-10 DIAGNOSIS — F209 Schizophrenia, unspecified: Secondary | ICD-10-CM | POA: Diagnosis not present

## 2023-10-10 LAB — LIPASE, BLOOD: Lipase: 44 U/L (ref 11–51)

## 2023-10-10 MED ORDER — ONDANSETRON 4 MG PO TBDP: 4.0000 mg | ORAL_TABLET | Freq: Once | ORAL | Status: DC

## 2023-10-10 MED ORDER — NICOTINE 7 MG/24HR TD PT24
7.0000 mg | MEDICATED_PATCH | Freq: Once | TRANSDERMAL | Status: DC
  Administered 2023-10-10: 7 mg via TRANSDERMAL
  Filled 2023-10-10: qty 1

## 2023-10-10 MED ORDER — NICOTINE POLACRILEX 2 MG MT GUM: 2.0000 mg | CHEWING_GUM | OROMUCOSAL | Status: DC | PRN

## 2023-10-10 MED ORDER — POTASSIUM CHLORIDE CRYS ER 20 MEQ PO TBCR
40.0000 meq | EXTENDED_RELEASE_TABLET | Freq: Once | ORAL | Status: AC
  Administered 2023-10-10: 40 meq via ORAL
  Filled 2023-10-10: qty 2

## 2023-10-10 MED ORDER — HYDROXYZINE HCL 25 MG PO TABS: 25.0000 mg | ORAL_TABLET | Freq: Three times a day (TID) | ORAL | Status: DC | PRN

## 2023-10-10 NOTE — ED Notes (Signed)
 Meal provided

## 2023-10-10 NOTE — ED Notes (Signed)
 Safe  transport  called  for  transfer to  [facility]

## 2023-10-10 NOTE — Progress Notes (Signed)
 [facility] received call from [place] at [facility].  Tobias inquired if group home is willing to accept pt back once discharged from [facility].  [facility] spoke to Elspeth [facility] Emergency planning/management officer - [facility] [phone]).  Elspeth stated pt has been there for 7-8 years with good behavior until today.  Patient is able to return to group home once discharged.   [place], [facility] [phone]

## 2023-10-10 NOTE — Progress Notes (Addendum)
 Per [facility], patient has been referred to the following facilities:   Service Provider Phone  [facility]  [phone]  [facility]  [phone]  [facility]  [phone]  [facility]  [phone]  [facility]  [phone]  [facility]  [phone]  [facility]  [phone]  [facility]  [phone]  [facility]  [phone]  [facility]  [phone]  [facility]  [phone]  [facility]  [phone]  [facility]  [phone]  [facility]  [phone]  [facility]  [phone]  [facility]  [phone]  [facility]  [phone], [place] [phone]

## 2023-10-10 NOTE — ED Notes (Signed)
 Pharmacy tech asking this RN for pt med rec form. Informed we do not have any apaper work from [facility] that this RN is aware of. Pharmacy stating trying to verify home meds. Informed this RN they would look into it.

## 2023-10-10 NOTE — ED Notes (Signed)
 VOL/psych consult pending/moved to [facility]

## 2023-10-10 NOTE — ED Notes (Signed)
 ED Secretary Jerel Attempted to call group home on behalf of Dr. Ernest no answer. LVM

## 2023-10-10 NOTE — ED Notes (Signed)
 Pt transported to [facility] Rm 5 by this RN and security.  Belonging bags 1 and 2 of 2 placed in pt appropriate bin in locked storage.

## 2023-10-10 NOTE — ED Provider Notes (Signed)
 [facility] Provider Note    Event Date/Time   First MD Initiated Contact with Patient 10/10/23 0101     (approximate)   History   Abdominal Pain   HPI  [name] is a 53 y.o. male who [facility] in [facility].  Patient reports that he got angry and that someone showed up to his group home and they told him he can either go to the hospital or go to jail.  Patient states that he stated he would rather go to the hospital.  He reports being upset at the group home and not wanting to be there.  He states that he is not having any abdominal pain.  He does report having difficulties with eating and being seen for this outpatient but denies any change in symptoms or worsening symptoms over the past couple of days.  When asked patient why he was here he stated to get away from the group home  Called the group home- staff reported that patient threw a chair into a window.  They report that he has never acted like this before and they are concerned about this outbreak and that he may need adjustment of medications.  They sent him here for psychiatric evaluation.  They deny him having any new changes to his abdomen.  Physical Exam   Triage Vital Signs: ED Triage Vitals [10/09/23 2124]  Encounter Vitals Group     BP 126/85     Girls Systolic BP Percentile      Girls Diastolic BP Percentile      Boys Systolic BP Percentile      Boys Diastolic BP Percentile      Pulse Rate 94     Resp 20     Temp 98.2 F (36.8 C)     Temp Source Oral     SpO2 95 %     Weight 150 lb (68 kg)     Height 5' 8 (1.727 m)     Head Circumference      Peak Flow      Pain Score 0     Pain Loc      Pain Education      Exclude from Growth Chart     Most recent vital signs: Vitals:   10/09/23 2124  BP: 126/85  Pulse: 94  Resp: 20  Temp: 98.2 F (36.8 C)  SpO2: 95%     General: Awake, no distress.  CV:  Good peripheral perfusion.  Resp:  Normal effort.  Abd:  No  distention.  Soft nontender.  No rebound, no guarding Other:  Patient is acting pleasant without any aggression   ED Results / Procedures / Treatments   Labs (all labs ordered are listed, but only abnormal results are displayed) Labs Reviewed  COMPREHENSIVE METABOLIC PANEL WITH GFR - Abnormal; Notable for the following components:      Result Value   Potassium 3.0 (*)    BUN <5 (*)    Albumin 3.4 (*)    All other components within normal limits  CBC - Abnormal; Notable for the following components:   WBC 11.5 (*)    All other components within normal limits  URINALYSIS, ROUTINE W REFLEX MICROSCOPIC - Abnormal; Notable for the following components:   Color, Urine YELLOW (*)    APPearance CLEAR (*)    Glucose, UA >=500 (*)    All other components within normal limits  LIPASE, BLOOD     PROCEDURES:  Critical Care performed: No  Procedures   MEDICATIONS ORDERED IN ED: Medications - No data to display   IMPRESSION / MDM / ASSESSMENT AND PLAN / ED COURSE  I reviewed the triage vital signs and the nursing notes.   Patient's presentation is most consistent with acute presentation with potential threat to life or bodily function.   Patient comes in with concerns for abdominal pain according to triage note but patient states that he only stated that so he did have to go to jail and that he was try to get out of going to his group home.  He does report some difficulties with eating but denies any new and acute changes today.  Workup was done from triage with UA without evidence of UTI, lipase that was normal and no evidence of pancreatitis CMP with slightly low potassium will replete but normal LFTs.  White count is slightly elevated but it seems like it is chronically elevated and he has no fever or evidence of sepsis.  I did call the group home to try to get patient back but they requested a psychiatric evaluation prior to patient returning to the group home.  They report that  they was not sure if he would need PRNs or changes in psychiatric medications.    The patient has been placed in psychiatric observation due to the need to provide a safe environment for the patient while obtaining psychiatric consultation and evaluation, as well as ongoing medical and medication management to treat the patient's condition.  The patient has not been placed under full IVC at this time.      FINAL CLINICAL IMPRESSION(S) / ED DIAGNOSES   Final diagnoses:  Agitation     Rx / DC Orders   ED Discharge Orders     None        Note:  This document was prepared using Dragon voice recognition software and may include unintentional dictation errors.   Ernest Ronal BRAVO, MD 10/10/23 [phone]

## 2023-10-10 NOTE — ED Notes (Signed)
 Patient is resting comfortably.

## 2023-10-10 NOTE — ED Provider Notes (Signed)
 Emergency Medicine Observation Re-evaluation Note  [name] is a 53 y.o. male, seen on rounds today.  Pt initially presented to the ED for complaints of Abdominal Pain Currently, the patient is resting.  Physical Exam  BP 126/85   Pulse 94   Temp 98.2 F (36.8 C) (Oral)   Resp 20   Ht 5' 8 (1.727 m)   Wt 68 kg   SpO2 95%   BMI 22.81 kg/m  Physical Exam .Gen:  No acute distress Resp:  Breathing easily and comfortably, no accessory muscle usage Neuro:  Moving all four extremities, no gross focal neuro deficits Psych:  Resting currently, calm when awake   ED Course / MDM  EKG:   I have reviewed the labs performed to date as well as medications administered while in observation.  Recent changes in the last 24 hours include no acute events.  Plan  Current plan is for psych dispo.    Waymond Lorelle Cummins, MD 10/10/23 [phone]

## 2023-10-10 NOTE — ED Notes (Signed)
 Pt signed paper copy of transfer consent and rider waiver form.

## 2023-10-10 NOTE — BH Assessment (Signed)
 Comprehensive Clinical Assessment (CCA) Screening, Triage and Referral Note  10/10/2023 [name] [id number]  [name], 53 year old male who presents to [facility] ED voluntarily for treatment. Per triage note, Pt arrives via EMS from [facility] [phone]) after throwing a table through a window; pt is here for abd pain.   During TTS assessment pt presents alert and oriented x 4, restless but cooperative, and mood-congruent with affect. The pt does not appear to be responding to internal or external stimuli. Neither is the pt presenting with any delusional thinking. Pt verified the information provided to triage RN.   Pt identifies his main complaint to be that he got into an argument with his dad that triggered this reaction of throwing a table through the window. In addition, patient reports he was upset with a male staff member, and he could not control his emotions. "I stay angry all the time." Patient states he is compliant with his medications but they do not seem to be working. Patient presents anxious and constantly moving. Patient reports poor eating and sleep habits. Patient admitted to using marijuana a couple of days ago to ease his stress. Pt currently endorsing passive SI but denies HI/AH. Patient did endorse visual hallucinations including seeing colors, shadows, and figures of heads. Pt is unable to contract for safety.    Per Zelda, NP, pt is recommended for inpatient psychiatric admission.    Chief Complaint:  Chief Complaint  Patient presents with   Abdominal Pain   Visit Diagnosis: Schizophrenia  Patient Reported Information How did you hear about us ? Self  What Is the Reason for Your Visit/Call Today? Pt arrives via EMS from [facility] group [facility] after throwing a table through a window.  How Long Has This Been Causing You Problems? > than 6 months  What Do You Feel Would Help You the Most Today? Medication(s); Stress Management   Have You Recently Had Any  Thoughts About Hurting Yourself? Yes  Are You Planning to Commit Suicide/Harm Yourself At This time? No   Have you Recently Had Thoughts About Hurting Someone [place]? No  Are You Planning to Harm Someone at This Time? No  Explanation: No data recorded  Have You Used Any Alcohol or Drugs in the Past 24 Hours? Yes  How Long Ago Did You Use Drugs or Alcohol? Marijuana  What Did You Use and How Much? No data recorded  Do You Currently Have a Therapist/Psychiatrist? No  Name of Therapist/Psychiatrist: No data recorded  Have You Been Recently Discharged From Any Office Practice or Programs? No  Explanation of Discharge From Practice/Program: No data recorded   CCA Screening Triage Referral Assessment Type of Contact: Face-to-Face  Telemedicine Service Delivery:   Is this Initial or Reassessment?   Date Telepsych consult ordered in [facility]:    Time Telepsych consult ordered in [facility]:    Location of Assessment: [facility] ED  Provider Location: [facility] ED    Collateral Involvement: None provided   Does Patient Have a Court Appointed Legal Guardian? No data recorded Name and Contact of Legal Guardian: No data recorded If Minor and Not Living with Parent(s), Who has Custody? No data recorded Is CPS involved or ever been involved? No data recorded Is APS involved or ever been involved? No data recorded  Patient Determined To Be At Risk for Harm To Self or Others Based on Review of Patient Reported Information or Presenting Complaint? Yes, for Self-Harm  Method: No Plan  Availability of Means:  No access or NA  Intent: Vague intent or NA  Notification Required: No need or identified person  Additional Information for Danger to Others Potential: No data recorded Additional Comments for Danger to Others Potential: No data recorded Are There Guns or Other Weapons in Your Home? No  Types of Guns/Weapons: No data recorded Are These Weapons Safely Secured?                            No data  recorded Who Could Verify You Are Able To Have These Secured: No data recorded Do You Have any Outstanding Charges, Pending Court Dates, Parole/Probation? No data recorded Contacted To Inform of Risk of Harm To Self or Others: No data recorded  Does Patient Present under Involuntary Commitment? No    [place] of Residence: [place]   Patient Currently Receiving the Following Services: Group Home; Medication Management   Determination of Need: Emergent (2 hours)   Options For Referral: ED Visit; Medication Management; Inpatient Hospitalization   Disposition Recommendation per psychiatric provider: Per Zelda, NP, pt is recommended for inpatient psychiatric admission.  Sinclair Alligood JONELLE Dolly, Counselor, LCAS-A

## 2023-10-10 NOTE — Progress Notes (Addendum)
 Patient has been accepted to [facility] for 10/10/23. Patient was assigned to Unit 800. Accepting physician is Dr. Oneil Charleston. Call report to [phone]. Representative was [employer].   ER Staff is aware of it: [place], ER Secretary Dr. Willo, ER MD Darlyn PEAK, Patient's Nurse  Address:  [address]                  [place], [place] [address]

## 2023-10-10 NOTE — ED Notes (Signed)
 [facility] aware of pt without UDS. Facility asked to obtain sample, pt tried to give sample but was unable to go at this time.

## 2023-10-10 NOTE — ED Notes (Signed)
 [facility] called to give report. Secretary Kate transferred this RN to Unit 800 to give report. No answer from unit 800, HIPAA compliant VM left for call back.
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
## 2023-12-09 ENCOUNTER — Emergency Department
Admission: EM | Admit: 2023-12-09 | Discharge: 2023-12-09 | Disposition: A | Discharge status: Home / Self Care | Attending: Emergency Medicine | Admitting: Emergency Medicine

## 2023-12-09 ENCOUNTER — Other Ambulatory Visit: Payer: Self-pay

## 2023-12-09 DIAGNOSIS — I2510 Atherosclerotic heart disease of native coronary artery without angina pectoris: Secondary | ICD-10-CM | POA: Insufficient documentation

## 2023-12-09 DIAGNOSIS — D72829 Elevated white blood cell count, unspecified: Secondary | ICD-10-CM | POA: Diagnosis not present

## 2023-12-09 DIAGNOSIS — R7989 Other specified abnormal findings of blood chemistry: Secondary | ICD-10-CM | POA: Diagnosis not present

## 2023-12-09 DIAGNOSIS — I10 Essential (primary) hypertension: Secondary | ICD-10-CM | POA: Insufficient documentation

## 2023-12-09 DIAGNOSIS — R110 Nausea: Secondary | ICD-10-CM | POA: Diagnosis present

## 2023-12-09 LAB — BASIC METABOLIC PANEL WITH GFR
Anion gap: 12 (ref 5–15)
BUN: <5 mg/dL — ABNORMAL LOW (ref 6–20)
CO2: 28 mmol/L (ref 22–32)
Calcium: 9.1 mg/dL (ref 8.9–10.3)
Chloride: 97 mmol/L — ABNORMAL LOW (ref 98–111)
Creatinine, Ser: 0.82 mg/dL (ref 0.61–1.24)
GFR, Estimated: >60 mL/min (ref >60)
Glucose, Bld: 134 mg/dL — ABNORMAL HIGH (ref 70–99)
Potassium: 3.6 mmol/L (ref 3.5–5.1)
Sodium: 136 mmol/L (ref 135–145)

## 2023-12-09 LAB — URINALYSIS, ROUTINE W REFLEX MICROSCOPIC
Bacteria, UA: NONE SEEN
Bilirubin Urine: NEGATIVE
Glucose, UA: NEGATIVE mg/dL
Ketones, ur: NEGATIVE mg/dL
Leukocytes,Ua: NEGATIVE
Nitrite: NEGATIVE
Protein, ur: 30 mg/dL — AB
RBC / HPF: 0 RBC/hpf (ref 0–5)
Specific Gravity, Urine: 1.023 (ref 1.005–1.030)
Squamous Epithelial / HPF: 0 /HPF (ref 0–5)
WBC, UA: 0 WBC/hpf (ref 0–5)
pH: 5 (ref 5.0–8.0)

## 2023-12-09 LAB — CBC
HCT: 43.8 % (ref 39.0–52.0)
Hemoglobin: 15.3 g/dL (ref 13.0–17.0)
MCH: 31.8 pg (ref 26.0–34.0)
MCHC: 34.9 g/dL (ref 30.0–36.0)
MCV: 91.1 fL (ref 80.0–100.0)
Platelets: 350 K/uL (ref 150–400)
RBC: 4.81 MIL/uL (ref 4.22–5.81)
RDW: 13.3 % (ref 11.5–15.5)
WBC: 14.5 K/uL — ABNORMAL HIGH (ref 4.0–10.5)
nRBC: 0 % (ref 0.0–0.2)

## 2023-12-09 LAB — RESP PANEL BY RT-PCR (RSV, FLU A&B, COVID)  RVPGX2
Influenza A by PCR: NEGATIVE
Influenza B by PCR: NEGATIVE
Resp Syncytial Virus by PCR: NEGATIVE
SARS Coronavirus 2 by RT PCR: NEGATIVE

## 2023-12-09 MED ORDER — ONDANSETRON 4 MG PO TBDP: 4.0000 mg | ORAL_TABLET | Freq: Once | ORAL | Status: AC

## 2023-12-09 MED ORDER — ONDANSETRON 4 MG PO TBDP: 4.0000 mg | ORAL_TABLET | Freq: Three times a day (TID) | ORAL | 0 refills | Status: AC | PRN

## 2023-12-09 MED ORDER — METOCLOPRAMIDE HCL 5 MG/ML IJ SOLN
10.0000 mg | Freq: Once | INTRAMUSCULAR | Status: DC
  Filled 2023-12-09: qty 2

## 2023-12-09 MED ORDER — OMEPRAZOLE MAGNESIUM 20 MG PO TBEC: 20.0000 mg | DELAYED_RELEASE_TABLET | Freq: Every day | ORAL | 0 refills | Status: AC

## 2023-12-09 MED ORDER — FAMOTIDINE IN NACL 20-0.9 MG/50ML-% IV SOLN
20.0000 mg | Freq: Once | INTRAVENOUS | Status: DC
  Filled 2023-12-09: qty 50

## 2023-12-09 MED ORDER — ONDANSETRON 4 MG PO TBDP
ORAL_TABLET | ORAL | Status: AC
  Administered 2023-12-09: 4 mg via ORAL
  Filled 2023-12-09: qty 1

## 2023-12-09 MED ORDER — SODIUM CHLORIDE 0.9 % IV BOLUS: 1000.0000 mL | Freq: Once | INTRAVENOUS | Status: DC

## 2023-12-09 NOTE — ED Triage Notes (Signed)
 Pt to ED via POV from Home for abdominal pain that has been going on for a while. Pt was hoping it would go away. Pt states has hx of bladder infections and it feels the same. Pt states it does burn a little bit when urinating. Pt states also having some SOB and has felt warm at home.

## 2023-12-09 NOTE — Discharge Instructions (Signed)
 You are seen in the emergency department for nausea and vomiting.  Your initial lab work was normal.  We had plan to do IV fluids, nausea medication and a CT scan to further evaluate for reasons for your ongoing nausea and abdominal pain.  You did not want to stay to complete workup.  You can follow-up with your primary care physician or return to the emergency department at any time.  You are given a prescription for nausea medication and acid reducing medication.  Try to avoid marijuana.  Stay hydrated and drink plenty of fluids.  Return for any ongoing or worsening symptoms.  zofran  (ondansetron ) - nausea medication, take 1 tablet every 8 hours as needed for nausea/vomiting.

## 2023-12-09 NOTE — ED Provider Notes (Signed)
 [facility] Provider Note    Event Date/Time   First MD Initiated Contact with Patient 12/09/23 1031     (approximate)   History   Abdominal Pain   HPI  [name] is a 53 y.o. male past medical history significant for coronary artery disease, shortness of breath, hypertension, schizophrenia, who presents to the emergency department with nausea.  Patient states that he has been feeling nauseous over the past 1 year.  States that he has been having worsening nausea and decreased p.o. intake over the past couple of days.  States that he was smoking marijuana regularly but stopped approximately a week ago.  Denies any active abdominal pain at this time.  Denies any significant alcohol use.  Denies chest pain or shortness of breath.  Denies any lower abdominal pain.  Denies any nausea or vomiting.  No change with urine output.     Physical Exam   Triage Vital Signs: ED Triage Vitals  Encounter Vitals Group     BP 12/09/23 1003 135/86     Girls Systolic BP Percentile --      Girls Diastolic BP Percentile --      Boys Systolic BP Percentile --      Boys Diastolic BP Percentile --      Pulse Rate 12/09/23 1003 90     Resp 12/09/23 1003 18     Temp 12/09/23 1003 98.9 F (37.2 C)     Temp Source 12/09/23 1003 Oral     SpO2 12/09/23 1003 98 %     Weight 12/09/23 1004 150 lb (68 kg)     Height 12/09/23 1004 5' 8 (1.727 m)     Head Circumference --      Peak Flow --      Pain Score 12/09/23 1003 3     Pain Loc --      Pain Education --      Exclude from Growth Chart --     Most recent vital signs: Vitals:   12/09/23 1200 12/09/23 1230  BP: 108/66 127/72  Pulse: 62 63  Resp:    Temp:    SpO2: 100% 100%    Physical Exam Constitutional:      Appearance: He is well-developed.  HENT:     Head: Atraumatic.  Eyes:     Conjunctiva/sclera: Conjunctivae normal.  Cardiovascular:     Rate and Rhythm: Regular rhythm.  Pulmonary:     Effort:  Pulmonary effort is normal. No respiratory distress.     Breath sounds: Normal breath sounds.  Abdominal:     General: Abdomen is flat.     Tenderness: There is no abdominal tenderness.  Musculoskeletal:     Cervical back: Normal range of motion.  Skin:    General: Skin is warm.     Capillary Refill: Capillary refill takes less than 2 seconds.  Neurological:     General: No focal deficit present.     Mental Status: He is alert. Mental status is at baseline.     IMPRESSION / MDM / ASSESSMENT AND PLAN / ED COURSE  I reviewed the triage vital signs and the nursing notes.  Differential diagnosis including gastritis/PUD, medication side effect, cyclical vomiting syndrome, dehydration, electrolyte abnormality, malignancy, acute cholecystitis, symptomatic cholelithiasis   No tachycardic or bradycardic dysrhythmias while on cardiac telemetry.  RADIOLOGY CT scan abdomen and pelvis with contrast ordered however patient declined did not want any further workup at this time LABS (all labs  ordered are listed, but only abnormal results are displayed) Labs interpreted as -    Labs Reviewed  CBC - Abnormal; Notable for the following components:      Result Value   WBC 14.5 (*)    All other components within normal limits  BASIC METABOLIC PANEL WITH GFR - Abnormal; Notable for the following components:   Chloride 97 (*)    Glucose, Bld 134 (*)    BUN <5 (*)    All other components within normal limits  URINALYSIS, ROUTINE W REFLEX MICROSCOPIC - Abnormal; Notable for the following components:   Color, Urine YELLOW (*)    APPearance TURBID (*)    Hgb urine dipstick MODERATE (*)    Protein, ur 30 (*)    All other components within normal limits  RESP PANEL BY RT-PCR (RSV, FLU A&B, COVID)  [id number]     MDM   Clinical Course as of 12/09/23 1253  Sat Dec 09, 2023  1216 Notified by nursing staff that he no longer wants to have a workup or CT scan or an IV.  Requesting to be discharged.   After my discussion with the patient we will do a trial of ODT Zofran  and a p.o. challenge.  LFTs and lipase currently in process. [SM]    Clinical Course User Index [SM] Suzanne Kirsch, MD   Patient's in the show lab work with leukocytosis of 14.5.  Does have chronically elevated white blood cell count.  No significant anemia normal platelets.  Creatinine at baseline and no significant electrolyte abnormalities.  Called to add on LFTs and lipase  Urine with no signs of ketones in the urine and no signs of a urinary tract infection  No chest pain or shortness of breath have a very low suspicion for ACS.  Discussed CT scan abdomen and pelvis with IV fluids antiemetics and Pepcid .  Patient ultimately declined and states that he is feeling better and he wants to go home.  Given an OTC Zofran  and drink a glass of ginger ale.  States he is feeling better.  Repeat abdominal exam continues to be benign with no abdominal tenderness to palpation no rebound or guarding.  Discussed that we did not have LFTs or lipase back on the patient.  Patient expressed understanding and states that he wants to follow-up as an outpatient with primary care.  Patient continues to have a benign abdominal exam.  Given a prescription for antiemetics and omeprazole.  Discussed return at any time if he wanted to complete workup or any new concerning symptoms.  PROCEDURES:  Critical Care performed: No  Procedures  Patient's presentation is most consistent with acute presentation with potential threat to life or bodily function.   MEDICATIONS ORDERED IN ED: Medications  ondansetron  (ZOFRAN -ODT) disintegrating tablet 4 mg (4 mg Oral Given 12/09/23 1219)    FINAL CLINICAL IMPRESSION(S) / ED DIAGNOSES   Final diagnoses:  Nausea     Rx / DC Orders   ED Discharge Orders          Ordered    ondansetron  (ZOFRAN -ODT) 4 MG disintegrating tablet  Every 8 hours PRN        12/09/23 1249    omeprazole (PRILOSEC OTC) 20 MG  tablet  Daily        12/09/23 1249             Note:  This document was prepared using Dragon voice recognition software and may include unintentional dictation errors.   Suzanne Kirsch,  MD 12/09/23 1253

## 2024-01-23 NOTE — Progress Notes (Signed)
 Concern for viral illness including COVID and influenza
